# Patient Record
Sex: Female | Born: 1990 | Race: Black or African American | Hispanic: No | Marital: Single | Smoking: Current every day smoker
Health system: Southern US, Community
[De-identification: ages and names within clinical notes are randomized; demographics above are authoritative.]

## PROBLEM LIST (undated history)

## (undated) DIAGNOSIS — J45909 Unspecified asthma, uncomplicated: Secondary | ICD-10-CM

## (undated) DIAGNOSIS — I10 Essential (primary) hypertension: Secondary | ICD-10-CM

## (undated) HISTORY — PX: DILATION AND CURETTAGE OF UTERUS: SHX78

---

## 2013-02-24 ENCOUNTER — Emergency Department (HOSPITAL_COMMUNITY)
Admission: EM | Admit: 2013-02-24 | Discharge: 2013-02-24 | Disposition: A | Discharge status: Home / Self Care | Payer: No Typology Code available for payment source | Attending: Emergency Medicine | Admitting: Emergency Medicine

## 2013-02-24 ENCOUNTER — Emergency Department (HOSPITAL_COMMUNITY): Payer: No Typology Code available for payment source

## 2013-02-24 ENCOUNTER — Encounter (HOSPITAL_COMMUNITY): Payer: Self-pay | Admitting: Emergency Medicine

## 2013-02-24 DIAGNOSIS — S0990XA Unspecified injury of head, initial encounter: Secondary | ICD-10-CM | POA: Insufficient documentation

## 2013-02-24 DIAGNOSIS — R519 Headache, unspecified: Secondary | ICD-10-CM

## 2013-02-24 DIAGNOSIS — S01501A Unspecified open wound of lip, initial encounter: Secondary | ICD-10-CM | POA: Insufficient documentation

## 2013-02-24 DIAGNOSIS — R259 Unspecified abnormal involuntary movements: Secondary | ICD-10-CM | POA: Insufficient documentation

## 2013-02-24 DIAGNOSIS — Y92410 Unspecified street and highway as the place of occurrence of the external cause: Secondary | ICD-10-CM | POA: Insufficient documentation

## 2013-02-24 DIAGNOSIS — S0993XA Unspecified injury of face, initial encounter: Secondary | ICD-10-CM | POA: Insufficient documentation

## 2013-02-24 DIAGNOSIS — Y939 Activity, unspecified: Secondary | ICD-10-CM | POA: Insufficient documentation

## 2013-02-24 DIAGNOSIS — M542 Cervicalgia: Secondary | ICD-10-CM

## 2013-02-24 DIAGNOSIS — J45909 Unspecified asthma, uncomplicated: Secondary | ICD-10-CM | POA: Insufficient documentation

## 2013-02-24 DIAGNOSIS — S01511A Laceration without foreign body of lip, initial encounter: Secondary | ICD-10-CM

## 2013-02-24 HISTORY — DX: Unspecified asthma, uncomplicated: J45.909

## 2013-02-24 MED ORDER — MORPHINE SULFATE 4 MG/ML IJ SOLN
4.0000 mg | Freq: Once | INTRAMUSCULAR | Status: AC
  Administered 2013-02-24: 4 mg via INTRAVENOUS
  Filled 2013-02-24: qty 1

## 2013-02-24 MED ORDER — IBUPROFEN 600 MG PO TABS: 600.0000 mg | ORAL_TABLET | Freq: Three times a day (TID) | ORAL | Status: DC | PRN
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
## 2015-03-29 ENCOUNTER — Emergency Department (HOSPITAL_BASED_OUTPATIENT_CLINIC_OR_DEPARTMENT_OTHER): Payer: Self-pay

## 2015-03-29 ENCOUNTER — Encounter (HOSPITAL_BASED_OUTPATIENT_CLINIC_OR_DEPARTMENT_OTHER): Payer: Self-pay | Admitting: *Deleted

## 2015-03-29 ENCOUNTER — Emergency Department (HOSPITAL_BASED_OUTPATIENT_CLINIC_OR_DEPARTMENT_OTHER)
Admission: EM | Admit: 2015-03-29 | Discharge: 2015-03-30 | Disposition: A | Discharge status: Home / Self Care | Payer: Self-pay | Attending: Emergency Medicine | Admitting: Emergency Medicine

## 2015-03-29 DIAGNOSIS — S39012A Strain of muscle, fascia and tendon of lower back, initial encounter: Secondary | ICD-10-CM | POA: Insufficient documentation

## 2015-03-29 DIAGNOSIS — Y990 Civilian activity done for income or pay: Secondary | ICD-10-CM | POA: Insufficient documentation

## 2015-03-29 DIAGNOSIS — X58XXXA Exposure to other specified factors, initial encounter: Secondary | ICD-10-CM | POA: Insufficient documentation

## 2015-03-29 DIAGNOSIS — N939 Abnormal uterine and vaginal bleeding, unspecified: Secondary | ICD-10-CM

## 2015-03-29 DIAGNOSIS — M545 Low back pain, unspecified: Secondary | ICD-10-CM

## 2015-03-29 DIAGNOSIS — O26891 Other specified pregnancy related conditions, first trimester: Secondary | ICD-10-CM

## 2015-03-29 DIAGNOSIS — F17210 Nicotine dependence, cigarettes, uncomplicated: Secondary | ICD-10-CM | POA: Insufficient documentation

## 2015-03-29 DIAGNOSIS — O9A211 Injury, poisoning and certain other consequences of external causes complicating pregnancy, first trimester: Secondary | ICD-10-CM | POA: Insufficient documentation

## 2015-03-29 DIAGNOSIS — Y9289 Other specified places as the place of occurrence of the external cause: Secondary | ICD-10-CM | POA: Insufficient documentation

## 2015-03-29 DIAGNOSIS — O99331 Smoking (tobacco) complicating pregnancy, first trimester: Secondary | ICD-10-CM | POA: Insufficient documentation

## 2015-03-29 DIAGNOSIS — O209 Hemorrhage in early pregnancy, unspecified: Secondary | ICD-10-CM | POA: Insufficient documentation

## 2015-03-29 DIAGNOSIS — Z3A01 Less than 8 weeks gestation of pregnancy: Secondary | ICD-10-CM | POA: Insufficient documentation

## 2015-03-29 DIAGNOSIS — Y9389 Activity, other specified: Secondary | ICD-10-CM | POA: Insufficient documentation

## 2015-03-29 DIAGNOSIS — O99511 Diseases of the respiratory system complicating pregnancy, first trimester: Secondary | ICD-10-CM | POA: Insufficient documentation

## 2015-03-29 DIAGNOSIS — Z79899 Other long term (current) drug therapy: Secondary | ICD-10-CM | POA: Insufficient documentation

## 2015-03-29 DIAGNOSIS — J45909 Unspecified asthma, uncomplicated: Secondary | ICD-10-CM | POA: Insufficient documentation

## 2015-03-29 DIAGNOSIS — Z3491 Encounter for supervision of normal pregnancy, unspecified, first trimester: Secondary | ICD-10-CM

## 2015-03-29 LAB — URINALYSIS, ROUTINE W REFLEX MICROSCOPIC
Bilirubin Urine: NEGATIVE
GLUCOSE, UA: NEGATIVE mg/dL
KETONES UR: NEGATIVE mg/dL
Leukocytes, UA: NEGATIVE
Nitrite: NEGATIVE
Protein, ur: NEGATIVE mg/dL
SPECIFIC GRAVITY, URINE: 1.008 (ref 1.005–1.030)
UROBILINOGEN UA: 0.2 mg/dL (ref 0.0–1.0)
pH: 6 (ref 5.0–8.0)

## 2015-03-29 LAB — URINE MICROSCOPIC-ADD ON

## 2015-03-29 LAB — PREGNANCY, URINE: PREG TEST UR: POSITIVE — AB

## 2015-03-29 NOTE — ED Notes (Signed)
Back pain x 3 days. No injury. No urinary symptoms. Movement makes it worse.

## 2015-03-29 NOTE — ED Provider Notes (Addendum)
CSN: [id number]     Arrival date & time 03/29/15  2035 History  This chart was scribed for Blanchie Dessert, MD by Meriel Pica, ED Scribe. This patient was seen in room MH03/MH03 and the patient's care was started 11:22 PM.   Chief Complaint  Patient presents with  . Back Pain   The history is provided by the patient. No language interpreter was used.   HPI Comments: Alyssa Mooney is a 24 y.o. female who presents to the Emergency Department complaining of gradually worsening, constant, moderate lower back pain onset 3 days ago. 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 She reports lifting items and bending over exacerbates her pain. She additionally reports moderate spotting for the last 2 weeks. LNMP June 23rd. Last urination was 2.5 hours ago. She has not taken any alleviating medication in the past 3 days. Pt reports 1 sexual partner and denies any past exposure to STDs. Additionally denies abdominal pain, any urinary symptoms, malodorous vaginal discharge or urine, taking birth control or daily medication.   Past Medical History  Diagnosis Date  . Asthma    History reviewed. No pertinent past surgical history. No family history on file. History  Substance Use Topics  . Smoking status: Current Every Day Smoker -- 0.50 packs/day for 3 years    Types: Cigarettes  . Smokeless tobacco: Not on file  . Alcohol Use: No   OB History    No data available     Review of Systems  Gastrointestinal: Negative for abdominal pain.  Genitourinary: Positive for vaginal bleeding ( spotting). Negative for dysuria, urgency, frequency, hematuria and vaginal discharge.  Musculoskeletal: Positive for back pain.  All other systems reviewed and are negative.   Allergies  Review of patient's allergies indicates no known allergies.  Home Medications   Prior to Admission medications   Medication Sig Start Date End Date Taking? Authorizing  Provider  albuterol (PROVENTIL HFA;VENTOLIN HFA) 108 (90 BASE) MCG/ACT inhaler Inhale 2 puffs into the lungs every 6 (six) hours as needed for wheezing.    Historical Provider, MD  albuterol (PROVENTIL) (2.5 MG/3ML) 0.083% nebulizer solution Take 2.5 mg by nebulization every 6 (six) hours as needed for wheezing.    Historical Provider, MD  ibuprofen (ADVIL,MOTRIN) 600 MG tablet Take 1 tablet (600 mg total) by mouth every 8 (eight) hours as needed for pain. 02/24/13   Jola Schmidt, MD   BP 146/75 mmHg  Pulse 82  Temp(Src) 98.7 F (37.1 C) (Oral)  Resp 20  Ht 5\' 2"  (1.575 m)  Wt 118 lb (53.524 kg)  BMI 21.58 kg/m2  SpO2 99%  LMP 02/18/2015 Physical Exam  Constitutional: She appears well-developed and well-nourished. No distress.  HENT:  Head: Normocephalic.  Eyes: Conjunctivae are normal. Right eye exhibits no discharge. Left eye exhibits no discharge. No scleral icterus.  Neck: No JVD present.  Cardiovascular: Normal rate, regular rhythm and normal heart sounds.   Pulmonary/Chest: Effort normal and breath sounds normal. No respiratory distress.  Abdominal: Soft. Bowel sounds are normal. She exhibits no distension and no mass. There is no tenderness. There is no rebound and no guarding.  Genitourinary: Uterus normal. There is no rash on the right labia. There is no rash on the left labia. Cervix exhibits no motion tenderness, no discharge and no friability. Right adnexum displays no mass and no tenderness. Left adnexum displays no mass and no tenderness. No tenderness or bleeding in  the vagina. No vaginal discharge found.  Musculoskeletal: She exhibits tenderness.  Para lumbar tenderness bilaterally.  Neurological: She is alert. Coordination normal.  Skin: Skin is warm. No rash noted. No erythema. No pallor.  Psychiatric: She has a normal mood and affect. Her behavior is normal.  Nursing note and vitals reviewed.   ED Course  Procedures  DIAGNOSTIC STUDIES: Oxygen Saturation is 99%  on RA, normal by my interpretation.    COORDINATION OF CARE: 11:29 PM Discussed treatment plan which includes to order and prescribe tylenol and a muscle relaxer with pt. Advised pt to apply a heating pad to affected area. Discussed urine pregnancy results with pt and performed US OB at bedside. Will give OB GYN referral to pt. Pt acknowledges and agrees to plan.  12:41 AM Pelvic exam performed with chaperone present throughout entire exam.   Labs Review Labs Reviewed  WET PREP, GENITAL - Abnormal; Notable for the following:    Clue Cells Wet Prep HPF POC FEW (*)    WBC, Wet Prep HPF POC FEW (*)    All other components within normal limits  URINALYSIS, ROUTINE W REFLEX MICROSCOPIC (NOT AT [facility]) - Abnormal; Notable for the following:    Hgb urine dipstick SMALL (*)    All other components within normal limits  PREGNANCY, URINE - Abnormal; Notable for the following:    Preg Test, Ur POSITIVE (*)    All other components within normal limits  HCG, QUANTITATIVE, PREGNANCY - Abnormal; Notable for the following:    hCG, Beta Chain, Quant, S 6754 (*)    All other components within normal limits  URINE MICROSCOPIC-ADD ON  ABO/RH  GC/CHLAMYDIA PROBE AMP ([place]) NOT AT [facility]    Imaging Review US Ob Comp Less 14 Wks  03/30/2015   CLINICAL DATA:  Lower back pain.  Vaginal spotting.  EXAM: OBSTETRIC <14 WK [place] AND TRANSVAGINAL OB US  TECHNIQUE: Both transabdominal and transvaginal ultrasound examinations were performed for complete evaluation of the gestation as well as the maternal uterus, adnexal regions, and pelvic cul-de-sac. Transvaginal technique was performed to assess early pregnancy.  COMPARISON:  None.  FINDINGS: Intrauterine gestational sac: Single  Yolk sac:  Yes  Embryo:  No  Cardiac Activity: No  MSD: 6.7  mm   5 w   3  d  [place] EDC: 11/26/2015  Maternal uterus/adnexae: 3 uterine fibroids are visible, the largest measuring 2 cm. There is an involuting left ovarian corpus luteal cyst. The  right ovary contains a simple unilocular 2.7 cm cyst.  IMPRESSION: Single intrauterine gestational sac containing a yolk sac but no visible fetal pole yet. Recommend follow-up to confirm viability. At least 3 uterine fibroids, measuring up to 2 cm.   Electronically Signed   By: Andreas Newport M.D.   On: 03/30/2015 00:48   US Ob Transvaginal  03/30/2015   CLINICAL DATA:  Lower back pain.  Vaginal spotting.  EXAM: OBSTETRIC <14 WK [place] AND TRANSVAGINAL OB US  TECHNIQUE: Both transabdominal and transvaginal ultrasound examinations were performed for complete evaluation of the gestation as well as the maternal uterus, adnexal regions, and pelvic cul-de-sac. Transvaginal technique was performed to assess early pregnancy.  COMPARISON:  None.  FINDINGS: Intrauterine gestational sac: Single  Yolk sac:  Yes  Embryo:  No  Cardiac Activity: No  MSD: 6.7  mm   5 w   3  d  [place] EDC: 11/26/2015  Maternal uterus/adnexae: 3 uterine fibroids are visible, the largest measuring 2 cm.  There is an involuting left ovarian corpus luteal cyst. The right ovary contains a simple unilocular 2.7 cm cyst.  IMPRESSION: Single intrauterine gestational sac containing a yolk sac but no visible fetal pole yet. Recommend follow-up to confirm viability. At least 3 uterine fibroids, measuring up to 2 cm.   Electronically Signed   By: Andreas Newport M.D.   On: 03/30/2015 00:48     EKG Interpretation None      MDM   Final diagnoses:  Lumbar strain, initial encounter  First trimester pregnancy    Patient here complaining of musculoskeletal back pain that started 3 days ago. She is less heavy boxes at work and is most likely the cause of her symptoms. She denies any vaginal discharge itching burning or abdominal pain. She has had intermittent spotting for the last 2 weeks with her last normal menses at the end of June. Urine today was negative for infection but urine pregnancy test was positive. Pelvic exam without any concerning  findings. Patient is unaware of her Rh status.  GC chlamydia and transvaginal ultrasound performed.  Ultrasound showing gestational sac and yolk sac but no obvious signs of a fetus at this time however measuring about 5 weeks 3 days which is consistent with last period.  Patient was given Flexeril and Tylenol for her lumbar strain and recommended follow-up within the next month to OB/GYN  I personally performed the services described in this documentation, which was scribed in my presence.  The recorded information has been reviewed and considered.    Blanchie Dessert, MD 03/30/15 [id number]  Blanchie Dessert, MD 03/30/15 [phone]

## 2015-03-30 LAB — WET PREP, GENITAL
Trich, Wet Prep: NONE SEEN
YEAST WET PREP: NONE SEEN

## 2015-03-30 LAB — GC/CHLAMYDIA PROBE AMP (~~LOC~~) NOT AT ARMC
CHLAMYDIA, DNA PROBE: NEGATIVE
NEISSERIA GONORRHEA: NEGATIVE

## 2015-03-30 LAB — ABO/RH: ABO/RH(D): O POS

## 2015-03-30 LAB — HCG, QUANTITATIVE, PREGNANCY: hCG, Beta Chain, Quant, S: 6754 m[IU]/mL — ABNORMAL HIGH (ref <5)

## 2015-03-30 MED ORDER — ACETAMINOPHEN 500 MG PO TABS: 500.0000 mg | ORAL_TABLET | Freq: Four times a day (QID) | ORAL | Status: AC | PRN

## 2015-03-30 MED ORDER — CYCLOBENZAPRINE HCL 10 MG PO TABS: 10.0000 mg | ORAL_TABLET | Freq: Two times a day (BID) | ORAL | Status: AC | PRN

## 2016-05-22 ENCOUNTER — Emergency Department (HOSPITAL_BASED_OUTPATIENT_CLINIC_OR_DEPARTMENT_OTHER)
Admission: EM | Admit: 2016-05-22 | Discharge: 2016-05-22 | Disposition: A | Discharge status: Home / Self Care | Payer: Self-pay | Attending: Dermatology | Admitting: Dermatology

## 2016-05-22 DIAGNOSIS — F17210 Nicotine dependence, cigarettes, uncomplicated: Secondary | ICD-10-CM | POA: Insufficient documentation

## 2016-05-22 DIAGNOSIS — Z7951 Long term (current) use of inhaled steroids: Secondary | ICD-10-CM | POA: Insufficient documentation

## 2016-05-22 DIAGNOSIS — Z5321 Procedure and treatment not carried out due to patient leaving prior to being seen by health care provider: Secondary | ICD-10-CM | POA: Insufficient documentation

## 2016-05-22 DIAGNOSIS — R112 Nausea with vomiting, unspecified: Secondary | ICD-10-CM | POA: Insufficient documentation

## 2016-05-22 DIAGNOSIS — J45909 Unspecified asthma, uncomplicated: Secondary | ICD-10-CM | POA: Insufficient documentation

## 2016-05-22 NOTE — ED Notes (Signed)
Pt very upset that she is having to wait.  Pt talking rude, family upset.  States that they can no longer wait to be seen.  No distress when leaving the ED.

## 2016-05-22 NOTE — ED Triage Notes (Signed)
Headache off and on per Pt.   Pt. Reports she has history of migrans and they come and go.  Pt. Spitting in triage not vomiting.

## 2020-11-11 ENCOUNTER — Encounter (HOSPITAL_BASED_OUTPATIENT_CLINIC_OR_DEPARTMENT_OTHER): Payer: Self-pay

## 2020-11-11 ENCOUNTER — Other Ambulatory Visit: Payer: Self-pay

## 2020-11-11 ENCOUNTER — Emergency Department (HOSPITAL_BASED_OUTPATIENT_CLINIC_OR_DEPARTMENT_OTHER)
Admission: EM | Admit: 2020-11-11 | Discharge: 2020-11-11 | Disposition: A | Discharge status: Home / Self Care | Payer: Medicaid Other | Attending: Emergency Medicine | Admitting: Emergency Medicine

## 2020-11-11 ENCOUNTER — Emergency Department (HOSPITAL_BASED_OUTPATIENT_CLINIC_OR_DEPARTMENT_OTHER): Payer: Medicaid Other

## 2020-11-11 DIAGNOSIS — F17210 Nicotine dependence, cigarettes, uncomplicated: Secondary | ICD-10-CM | POA: Insufficient documentation

## 2020-11-11 DIAGNOSIS — J45909 Unspecified asthma, uncomplicated: Secondary | ICD-10-CM | POA: Diagnosis not present

## 2020-11-11 DIAGNOSIS — N939 Abnormal uterine and vaginal bleeding, unspecified: Secondary | ICD-10-CM | POA: Insufficient documentation

## 2020-11-11 DIAGNOSIS — R102 Pelvic and perineal pain: Secondary | ICD-10-CM | POA: Diagnosis present

## 2020-11-11 DIAGNOSIS — D259 Leiomyoma of uterus, unspecified: Secondary | ICD-10-CM

## 2020-11-11 DIAGNOSIS — N938 Other specified abnormal uterine and vaginal bleeding: Secondary | ICD-10-CM

## 2020-11-11 LAB — CBC WITH DIFFERENTIAL/PLATELET
Abs Immature Granulocytes: 0.03 10*3/uL (ref 0.00–0.07)
Basophils Absolute: 0 10*3/uL (ref 0.0–0.1)
Basophils Relative: 0 %
Eosinophils Absolute: 0.3 10*3/uL (ref 0.0–0.5)
Eosinophils Relative: 5 %
HCT: 38.8 % (ref 36.0–46.0)
Hemoglobin: 13.1 g/dL (ref 12.0–15.0)
Immature Granulocytes: 0 %
Lymphocytes Relative: 25 %
Lymphs Abs: 1.9 10*3/uL (ref 0.7–4.0)
MCH: 33 pg (ref 26.0–34.0)
MCHC: 33.8 g/dL (ref 30.0–36.0)
MCV: 97.7 fL (ref 80.0–100.0)
Monocytes Absolute: 0.7 10*3/uL (ref 0.1–1.0)
Monocytes Relative: 9 %
Neutro Abs: 4.5 10*3/uL (ref 1.7–7.7)
Neutrophils Relative %: 61 %
Platelets: 207 10*3/uL (ref 150–400)
RBC: 3.97 MIL/uL (ref 3.87–5.11)
RDW: 13.2 % (ref 11.5–15.5)
WBC: 7.4 10*3/uL (ref 4.0–10.5)
nRBC: 0 % (ref 0.0–0.2)

## 2020-11-11 LAB — URINALYSIS, ROUTINE W REFLEX MICROSCOPIC
Bilirubin Urine: NEGATIVE
Glucose, UA: NEGATIVE mg/dL
Ketones, ur: NEGATIVE mg/dL
Leukocytes,Ua: NEGATIVE
Nitrite: NEGATIVE
Protein, ur: NEGATIVE mg/dL
Specific Gravity, Urine: <1.005 — ABNORMAL LOW (ref 1.005–1.030)
pH: 6 (ref 5.0–8.0)

## 2020-11-11 LAB — BASIC METABOLIC PANEL
Anion gap: 5 (ref 5–15)
BUN: 13 mg/dL (ref 6–20)
CO2: 25 mmol/L (ref 22–32)
Calcium: 8.9 mg/dL (ref 8.9–10.3)
Chloride: 108 mmol/L (ref 98–111)
Creatinine, Ser: 0.79 mg/dL (ref 0.44–1.00)
GFR, Estimated: >60 mL/min (ref >60)
Glucose, Bld: 95 mg/dL (ref 70–99)
Potassium: 4.2 mmol/L (ref 3.5–5.1)
Sodium: 138 mmol/L (ref 135–145)

## 2020-11-11 LAB — URINALYSIS, MICROSCOPIC (REFLEX)

## 2020-11-11 LAB — PREGNANCY, URINE: Preg Test, Ur: NEGATIVE

## 2020-11-11 MED ORDER — FENTANYL CITRATE (PF) 100 MCG/2ML IJ SOLN
50.0000 ug | Freq: Once | INTRAMUSCULAR | Status: AC
  Administered 2020-11-11: 50 ug via INTRAVENOUS
  Filled 2020-11-11: qty 2

## 2020-11-11 MED ORDER — MEGESTROL ACETATE 40 MG PO TABS: 40.0000 mg | ORAL_TABLET | Freq: Every day | ORAL | 0 refills | Status: AC

## 2020-11-11 MED ORDER — SODIUM CHLORIDE 0.9 % IV BOLUS
1000.0000 mL | Freq: Once | INTRAVENOUS | Status: AC
  Administered 2020-11-11: 1000 mL via INTRAVENOUS

## 2020-11-11 MED ORDER — IBUPROFEN 600 MG PO TABS: 600.0000 mg | ORAL_TABLET | Freq: Three times a day (TID) | ORAL | 0 refills | Status: AC | PRN

## 2020-11-11 MED ORDER — MORPHINE SULFATE (PF) 4 MG/ML IV SOLN
4.0000 mg | Freq: Once | INTRAVENOUS | Status: AC
Start: 2020-11-11 — End: 2020-11-11
  Administered 2020-11-11: 4 mg via INTRAVENOUS
  Filled 2020-11-11: qty 1

## 2020-11-11 NOTE — ED Triage Notes (Signed)
Lower abd pain radiating to bilateral flanks.  Pt also c/o vaginal bleeding x 3 weeks with some irregular menstrual cycles.  Denies vaginal discharge otherwise or any urinary sxs.

## 2020-11-11 NOTE — ED Provider Notes (Signed)
[facility] EMERGENCY DEPARTMENT Provider Note   CSN: [id number] Arrival date & time: 11/11/20  0848     History Chief Complaint  Patient presents with  . Abdominal Pain    Alyssa Mooney is a 30 y.o. female.  She is G1, P0 with prior history of miscarriage.  Complaining of vaginal bleeding for 3 weeks sometimes bright red sometimes dark red.  Using 5 pads a day.  Started with abdominal pain lower 1 week ago radiates into back.  8 out of 10 intensity.  No fevers or chills no chest pain or shortness of breath.  The history is provided by the patient.  Abdominal Pain Pain location:  Suprapubic Pain quality: cramping   Pain radiates to:  Back, L flank and R flank Pain severity:  Severe Onset quality:  Gradual Duration:  1 week Timing:  Constant Progression:  Unchanged Chronicity:  New Context: not trauma   Relieved by:  Nothing Worsened by:  Nothing Ineffective treatments:  Acetaminophen Associated symptoms: vaginal bleeding   Associated symptoms: no chest pain, no dysuria, no fever, no nausea, no shortness of breath, no sore throat and no vomiting   Vaginal bleeding:    Quality:  Bright red and dark red   Severity:  Moderate   Number of pads used:  5/day   Onset quality:  Gradual   Progression:  Waxing and waning   Chronicity:  New      Past Medical History:  Diagnosis Date  . Asthma     There are no problems to display for this patient.   History reviewed. No pertinent surgical history.   OB History   No obstetric history on file.     History reviewed. No pertinent family history.  Social History   Tobacco Use  . Smoking status: Current Every Day Smoker    Packs/day: 0.50    Years: 3.00    Pack years: 1.50    Types: Cigarettes  . Smokeless tobacco: Never Used  Vaping Use  . Vaping Use: Never used  Substance Use Topics  . Alcohol use: No  . Drug use: No    Home Medications Prior to Admission medications   Medication Sig Start  Date End Date Taking? Authorizing Provider  acetaminophen (TYLENOL) 500 MG tablet Take 1 tablet (500 mg total) by mouth every 6 (six) hours as needed. 03/30/15  Yes Plunkett, Loree Fee, MD  albuterol (PROVENTIL HFA;VENTOLIN HFA) 108 (90 BASE) MCG/ACT inhaler Inhale 2 puffs into the lungs every 6 (six) hours as needed for wheezing.   Yes [provider]  albuterol (PROVENTIL) (2.5 MG/3ML) 0.083% nebulizer solution Take 2.5 mg by nebulization every 6 (six) hours as needed for wheezing.   Yes [provider]  ibuprofen (ADVIL,MOTRIN) 600 MG tablet Take 1 tablet (600 mg total) by mouth every 8 (eight) hours as needed for pain. 02/24/13  Yes Jola Schmidt, MD  cyclobenzaprine (FLEXERIL) 10 MG tablet Take 1 tablet (10 mg total) by mouth 2 (two) times daily as needed for muscle spasms. 03/30/15   Blanchie Dessert, MD    Allergies    Patient has no known allergies.  Review of Systems   Review of Systems  Constitutional: Negative for fever.  HENT: Negative for sore throat.   Eyes: Negative for visual disturbance.  Respiratory: Negative for shortness of breath.   Cardiovascular: Negative for chest pain.  Gastrointestinal: Positive for abdominal pain. Negative for nausea and vomiting.  Genitourinary: Positive for vaginal bleeding. Negative for dysuria.  Musculoskeletal:  Positive for back pain.  Skin: Negative for rash.  Neurological: Negative for headaches.    Physical Exam Updated Vital Signs BP (!) 127/91 (BP Location: Right Arm)   Pulse 94   Temp 98.5 F (36.9 C) (Oral)   Resp 16   Ht 5\' 1"  (1.549 m)   Wt 52.2 kg   SpO2 100%   BMI 21.73 kg/m   Physical Exam Vitals and nursing note reviewed.  Constitutional:      General: She is not in acute distress.    Appearance: She is well-developed.  HENT:     Head: Normocephalic and atraumatic.  Eyes:     Conjunctiva/sclera: Conjunctivae normal.  Cardiovascular:     Rate and Rhythm: Normal rate and regular rhythm.     Heart  sounds: No murmur heard.   Pulmonary:     Effort: Pulmonary effort is normal. No respiratory distress.     Breath sounds: Normal breath sounds.  Abdominal:     Palpations: Abdomen is soft. There is no mass.     Tenderness: There is abdominal tenderness in the right lower quadrant, suprapubic area and left lower quadrant. There is no guarding or rebound.  Musculoskeletal:        General: No deformity or signs of injury. Normal range of motion.     Cervical back: Neck supple.  Skin:    General: Skin is warm and dry.     Capillary Refill: Capillary refill takes less than 2 seconds.  Neurological:     General: No focal deficit present.     Mental Status: She is alert.     ED Results / Procedures / Treatments   Labs (all labs ordered are listed, but only abnormal results are displayed) Labs Reviewed  URINALYSIS, ROUTINE W REFLEX MICROSCOPIC - Abnormal; Notable for the following components:      Result Value   Specific Gravity, Urine <1.005 (*)    Hgb urine dipstick LARGE (*)    All other components within normal limits  URINALYSIS, MICROSCOPIC (REFLEX) - Abnormal; Notable for the following components:   Bacteria, UA RARE (*)    All other components within normal limits  PREGNANCY, URINE  BASIC METABOLIC PANEL  CBC WITH DIFFERENTIAL/PLATELET    EKG None  Radiology US PELVIC COMPLETE W TRANSVAGINAL AND TORSION R/O  Result Date: 11/11/2020 CLINICAL DATA:  Pelvic pain EXAM: TRANSABDOMINAL AND TRANSVAGINAL ULTRASOUND OF PELVIS DOPPLER ULTRASOUND OF OVARIES TECHNIQUE: Study was performed transabdominally to optimize pelvic field of view evaluation and transvaginally to optimize internal visceral architecture evaluation. Color and duplex Doppler ultrasound was utilized to evaluate blood flow to the ovaries. COMPARISON:  CT pelvis July 17, 2020 FINDINGS: Uterus Measurements: Uterus measures 10.9 x 7.2 x 8.8 cm = volume: 365.0 mL. There is diffuse inhomogeneity throughout the uterus  consistent with diffuse leiomyomatous change. There is a focal leiomyoma along the superior fundus measuring 4.9 x 5.4 x 5.0 cm. Mid fundal leiomyomas measuring 1.8 x 1.8 x 2.1 cm and 2.2 x 1.4 x 1.6 cm noted. Probable smaller leiomyomas are less well-defined. Endometrium Thickness: 5 mm.  No focal abnormality visualized. Right ovary Measurements: 3.7 x 1.5 x 2.1 cm = volume: 6.2 mL. Normal appearance/no adnexal mass. Left ovary Unable to visualize by transabdominal transvaginal technique. No left-sided pelvic mass evident. Pulsed Doppler evaluation of right ovary demonstrates normal low-resistance arterial and venous waveforms. Left ovary could not be interrogated. Other findings No abnormal free fluid. IMPRESSION: 1. Enlarged leiomyomatous uterus with several well-defined leiomyomas  and probable smaller leiomyomas elsewhere throughout the uterus. 2.  Normal appearing right ovary.  No evident right ovarian torsion. 3. Unable to visualize left ovary by either transabdominal transvaginal technique. No left-sided pelvic mass evident. 4.  No evident free pelvic fluid. Electronically Signed   By: Lowella Grip III M.D.   On: 11/11/2020 11:23    Procedures Procedures   Medications Ordered in ED Medications  sodium chloride 0.9 % bolus 1,000 mL (0 mLs Intravenous Stopped 11/11/20 1052)  fentaNYL (SUBLIMAZE) injection 50 mcg (50 mcg Intravenous Given 11/11/20 0924)  morphine 4 MG/ML injection 4 mg (4 mg Intravenous Given 11/11/20 1100)    ED Course  I have reviewed the triage vital signs and the nursing notes.  Pertinent labs & imaging results that were available during my care of the patient were reviewed by me and considered in my medical decision making (see chart for details).  Clinical Course as of 11/11/20 2035  Thu Nov 11, 2020  1143 Discussed with Dr. Rosana Hoes from gynecology.  She recommended Megace 40 mg daily can increase to twice daily if not effective.  NSAIDs.  Follow-up with gynecology.  [MB]  1740 Reviewed recommendations from gynecology with patient and she is comfortable plan for discharge to follow-up with her gynecologist.  Will provide prescriptions for Megace and ibuprofen. [MB]    Clinical Course User Index [MB] Hayden Rasmussen, MD   MDM Rules/Calculators/A&P                         This patient complains of lower abdominal pain, late.,  Vaginal bleeding; this involves an extensive number of treatment Options and is a complaint that carries with it a high risk of complications and Morbidity. The differential includes dysfunctional uterine bleeding, miscarriage, ectopic  I ordered, reviewed and interpreted labs, which included CBC with normal white count and hemoglobin, chemistries normal, pregnancy test negative, urinalysis without gross signs of infection I ordered medication IV fluids and IV pain medication I ordered imaging studies which included pelvic ultrasound and I independently    visualized and interpreted imaging which showed no evidence of torsion, does have leiomyomas Previous records obtained and reviewed in epic, no recent admissions I consulted Dr. Rosana Hoes gynecology and discussed lab and imaging findings  Critical Interventions: None  After the interventions stated above, I reevaluated the patient and found patient to be symptomatically improved.  Reviewed recommendations from gynecology with patient and she is comfortable with plan for trial of Megace.  NSAIDs.  She will follow up with her gynecologist next week.  Return instructions discussed   Final Clinical Impression(s) / ED Diagnoses Final diagnoses:  Dysfunctional uterine bleeding  Uterine leiomyoma, unspecified location    Rx / DC Orders ED Discharge Orders         Ordered    megestrol (MEGACE) 40 MG tablet  Daily        11/11/20 1149    ibuprofen (ADVIL) 600 MG tablet  Every 8 hours PRN        11/11/20 1149           Hayden Rasmussen, MD 11/11/20 2037

## 2020-11-11 NOTE — Discharge Instructions (Addendum)
You were seen in the emergency department for 3 weeks of vaginal bleeding and low abdominal pain.  You had blood work that showed your red blood cells were stable.  Your pregnancy test was negative.  Your pelvic ultrasound showed some fibroids.  We are starting you on some medication to help with pain and help with bleeding.  Please follow-up with your gynecologist as scheduled.  Return to the emergency department for any worsening or concerning symptoms

## 2021-06-23 ENCOUNTER — Emergency Department (HOSPITAL_BASED_OUTPATIENT_CLINIC_OR_DEPARTMENT_OTHER)
Admission: EM | Admit: 2021-06-23 | Discharge: 2021-06-23 | Disposition: A | Discharge status: Home / Self Care | Payer: Medicaid Other | Attending: Student | Admitting: Student

## 2021-06-23 ENCOUNTER — Other Ambulatory Visit: Payer: Self-pay

## 2021-06-23 ENCOUNTER — Emergency Department (HOSPITAL_BASED_OUTPATIENT_CLINIC_OR_DEPARTMENT_OTHER): Payer: Medicaid Other

## 2021-06-23 ENCOUNTER — Encounter (HOSPITAL_BASED_OUTPATIENT_CLINIC_OR_DEPARTMENT_OTHER): Payer: Self-pay | Admitting: *Deleted

## 2021-06-23 DIAGNOSIS — R0789 Other chest pain: Secondary | ICD-10-CM | POA: Diagnosis not present

## 2021-06-23 DIAGNOSIS — F17210 Nicotine dependence, cigarettes, uncomplicated: Secondary | ICD-10-CM | POA: Diagnosis not present

## 2021-06-23 DIAGNOSIS — R0602 Shortness of breath: Secondary | ICD-10-CM | POA: Diagnosis not present

## 2021-06-23 DIAGNOSIS — I10 Essential (primary) hypertension: Secondary | ICD-10-CM | POA: Diagnosis not present

## 2021-06-23 DIAGNOSIS — R109 Unspecified abdominal pain: Secondary | ICD-10-CM | POA: Insufficient documentation

## 2021-06-23 DIAGNOSIS — J45909 Unspecified asthma, uncomplicated: Secondary | ICD-10-CM | POA: Diagnosis not present

## 2021-06-23 DIAGNOSIS — Z79899 Other long term (current) drug therapy: Secondary | ICD-10-CM | POA: Diagnosis not present

## 2021-06-23 DIAGNOSIS — R102 Pelvic and perineal pain: Secondary | ICD-10-CM | POA: Insufficient documentation

## 2021-06-23 HISTORY — DX: Essential (primary) hypertension: I10

## 2021-06-23 LAB — COMPREHENSIVE METABOLIC PANEL
ALT: 12 U/L (ref 0–44)
AST: 16 U/L (ref 15–41)
Albumin: 4.2 g/dL (ref 3.5–5.0)
Alkaline Phosphatase: 50 U/L (ref 38–126)
Anion gap: 5 (ref 5–15)
BUN: 12 mg/dL (ref 6–20)
CO2: 24 mmol/L (ref 22–32)
Calcium: 9 mg/dL (ref 8.9–10.3)
Chloride: 109 mmol/L (ref 98–111)
Creatinine, Ser: 0.91 mg/dL (ref 0.44–1.00)
GFR, Estimated: >60 mL/min (ref >60)
Glucose, Bld: 98 mg/dL (ref 70–99)
Potassium: 4.1 mmol/L (ref 3.5–5.1)
Sodium: 138 mmol/L (ref 135–145)
Total Bilirubin: 0.4 mg/dL (ref 0.3–1.2)
Total Protein: 6.7 g/dL (ref 6.5–8.1)

## 2021-06-23 LAB — URINALYSIS, ROUTINE W REFLEX MICROSCOPIC
Bilirubin Urine: NEGATIVE
Glucose, UA: NEGATIVE mg/dL
Ketones, ur: NEGATIVE mg/dL
Leukocytes,Ua: NEGATIVE
Nitrite: NEGATIVE
Protein, ur: NEGATIVE mg/dL
Specific Gravity, Urine: 1.025 (ref 1.005–1.030)
pH: 5 (ref 5.0–8.0)

## 2021-06-23 LAB — CBC WITH DIFFERENTIAL/PLATELET
Abs Immature Granulocytes: 0.04 10*3/uL (ref 0.00–0.07)
Basophils Absolute: 0.1 10*3/uL (ref 0.0–0.1)
Basophils Relative: 1 %
Eosinophils Absolute: 0.4 10*3/uL (ref 0.0–0.5)
Eosinophils Relative: 4 %
HCT: 40.8 % (ref 36.0–46.0)
Hemoglobin: 14 g/dL (ref 12.0–15.0)
Immature Granulocytes: 0 %
Lymphocytes Relative: 22 %
Lymphs Abs: 2.3 10*3/uL (ref 0.7–4.0)
MCH: 32.8 pg (ref 26.0–34.0)
MCHC: 34.3 g/dL (ref 30.0–36.0)
MCV: 95.6 fL (ref 80.0–100.0)
Monocytes Absolute: 1 10*3/uL (ref 0.1–1.0)
Monocytes Relative: 9 %
Neutro Abs: 6.7 10*3/uL (ref 1.7–7.7)
Neutrophils Relative %: 64 %
Platelets: 213 10*3/uL (ref 150–400)
RBC: 4.27 MIL/uL (ref 3.87–5.11)
RDW: 14.1 % (ref 11.5–15.5)
WBC: 10.4 10*3/uL (ref 4.0–10.5)
nRBC: 0 % (ref 0.0–0.2)

## 2021-06-23 LAB — PREGNANCY, URINE: Preg Test, Ur: NEGATIVE

## 2021-06-23 LAB — URINALYSIS, MICROSCOPIC (REFLEX): RBC / HPF: >50 RBC/hpf (ref 0–5)

## 2021-06-23 LAB — LIPASE, BLOOD: Lipase: 26 U/L (ref 11–51)

## 2021-06-23 LAB — D-DIMER, QUANTITATIVE: D-Dimer, Quant: <0.27 ug/mL-FEU (ref 0.00–0.50)

## 2021-06-23 LAB — TROPONIN I (HIGH SENSITIVITY): Troponin I (High Sensitivity): <2 ng/L (ref <18)

## 2021-06-23 MED ORDER — KETOROLAC TROMETHAMINE 15 MG/ML IJ SOLN
15.0000 mg | Freq: Once | INTRAMUSCULAR | Status: AC
  Administered 2021-06-23: 15 mg via INTRAVENOUS
  Filled 2021-06-23: qty 1

## 2021-06-23 MED ORDER — KETOROLAC TROMETHAMINE 15 MG/ML IJ SOLN: 15.0000 mg | Freq: Once | INTRAMUSCULAR | Status: DC

## 2021-06-23 MED ORDER — LIDOCAINE 5 % EX PTCH
1.0000 | MEDICATED_PATCH | CUTANEOUS | Status: DC
  Administered 2021-06-23: 1 via TRANSDERMAL
  Filled 2021-06-23: qty 1

## 2021-06-23 NOTE — ED Triage Notes (Signed)
Pt c/o mid to left chest pain x 2 days and describes the pain as sharp; pt states she was sitting down when the pain started; pt c/o sob

## 2021-06-23 NOTE — ED Notes (Signed)
Pt discharged to home. Discharge instructions have been discussed with patient and/or family members. Pt verbally acknowledges understanding d/c instructions, and endorses comprehension to checkout at registration before leaving.  °

## 2021-06-23 NOTE — ED Notes (Signed)
ED Provider at bedside. 

## 2021-06-23 NOTE — ED Provider Notes (Signed)
[facility] EMERGENCY DEPARTMENT Provider Note   CSN: [id number] Arrival date & time: 06/23/21  3354     History Chief Complaint  Patient presents with   Chest Pain    Alyssa Mooney is a 30 y.o. female who presents emergency department for evaluation of chest pain.  Patient states she has had left-sided chest pain along the rib underneath the left breast for approximately 2 days.  She describes the pain as a 5 out of 10, sharp, worse with stretching and raising her arms above her head.  She endorses some mild shortness of breath.  Denies nausea, vomiting, diaphoresis or exertional symptoms.  She also endorses vaginal bleeding and states that her last menstrual period was 1 month ago.  She also endorses bilateral lower pelvic pain that is crampy in nature.  Denies dysuria, vaginal discharge, headache, fever, cough or other systemic symptoms.   Chest Pain Associated symptoms: abdominal pain and shortness of breath   Associated symptoms: no back pain, no cough, no fever, no palpitations and no vomiting       Past Medical History:  Diagnosis Date   Asthma    Hypertension     There are no problems to display for this patient.   Past Surgical History:  Procedure Laterality Date   DILATION AND CURETTAGE OF UTERUS       OB History   No obstetric history on file.     History reviewed. No pertinent family history.  Social History   Tobacco Use   Smoking status: Every Day    Packs/day: 0.50    Years: 3.00    Pack years: 1.50    Types: Cigarettes   Smokeless tobacco: Never  Vaping Use   Vaping Use: Never used  Substance Use Topics   Alcohol use: No   Drug use: No    Home Medications Prior to Admission medications   Medication Sig Start Date End Date Taking? Authorizing Provider  acetaminophen (TYLENOL) 500 MG tablet Take 1 tablet (500 mg total) by mouth every 6 (six) hours as needed. 03/30/15   Blanchie Dessert, MD  albuterol (PROVENTIL HFA;VENTOLIN  HFA) 108 (90 BASE) MCG/ACT inhaler Inhale 2 puffs into the lungs every 6 (six) hours as needed for wheezing.    [provider]  albuterol (PROVENTIL) (2.5 MG/3ML) 0.083% nebulizer solution Take 2.5 mg by nebulization every 6 (six) hours as needed for wheezing.    [provider]  cyclobenzaprine (FLEXERIL) 10 MG tablet Take 1 tablet (10 mg total) by mouth 2 (two) times daily as needed for muscle spasms. 03/30/15   Blanchie Dessert, MD  ibuprofen (ADVIL) 600 MG tablet Take 1 tablet (600 mg total) by mouth every 8 (eight) hours as needed for moderate pain. 11/11/20   Hayden Rasmussen, MD  megestrol (MEGACE) 40 MG tablet Take 1 tablet (40 mg total) by mouth daily. May increase to twice a day if not effective 11/11/20   Hayden Rasmussen, MD    Allergies    Patient has no known allergies.  Review of Systems   Review of Systems  Constitutional:  Negative for chills and fever.  HENT:  Negative for ear pain and sore throat.   Eyes:  Negative for pain and visual disturbance.  Respiratory:  Positive for shortness of breath. Negative for cough.   Cardiovascular:  Positive for chest pain. Negative for palpitations.  Gastrointestinal:  Positive for abdominal pain. Negative for vomiting.  Genitourinary:  Positive for vaginal bleeding. Negative for dysuria  and hematuria.  Musculoskeletal:  Negative for arthralgias and back pain.  Skin:  Negative for color change and rash.  Neurological:  Negative for seizures and syncope.  All other systems reviewed and are negative.  Physical Exam Updated Vital Signs BP 135/90 (BP Location: Right Arm)   Pulse 81   Temp 97.9 F (36.6 C) (Oral)   Resp 16   Ht 5\' 1"  (1.549 m)   Wt 56.7 kg   LMP 05/25/2021   SpO2 99%   BMI 23.62 kg/m   Physical Exam Vitals and nursing note reviewed.  Constitutional:      General: She is not in acute distress.    Appearance: She is well-developed.  HENT:     Head: Normocephalic and atraumatic.  Eyes:      Conjunctiva/sclera: Conjunctivae normal.  Cardiovascular:     Rate and Rhythm: Normal rate and regular rhythm.     Heart sounds: No murmur heard. Pulmonary:     Effort: Pulmonary effort is normal. No respiratory distress.     Breath sounds: Normal breath sounds.  Chest:     Chest wall: Tenderness (Along the rib line under the left breast) present.  Abdominal:     Palpations: Abdomen is soft.     Tenderness: There is abdominal tenderness (Mild left and right lower quadrant).  Musculoskeletal:     Cervical back: Neck supple.  Skin:    General: Skin is warm and dry.  Neurological:     Mental Status: She is alert.    ED Results / Procedures / Treatments   Labs (all labs ordered are listed, but only abnormal results are displayed) Labs Reviewed  COMPREHENSIVE METABOLIC PANEL  CBC WITH DIFFERENTIAL/PLATELET  D-DIMER, QUANTITATIVE  LIPASE, BLOOD  URINALYSIS, ROUTINE W REFLEX MICROSCOPIC  PREGNANCY, URINE  TROPONIN I (HIGH SENSITIVITY)    EKG EKG Interpretation  Date/Time:  Thursday June 23 2021 06:58:38 EDT Ventricular Rate:  64 PR Interval:  134 QRS Duration: 73 QT Interval:  378 QTC Calculation: 390 R Axis:   72 Text Interpretation: Sinus rhythm Confirmed by Tamico Mundo ([id number]) on 06/23/2021 7:28:48 AM  Radiology No results found.  Procedures Procedures   Medications Ordered in ED Medications - No data to display  ED Course  I have reviewed the triage vital signs and the nursing notes.  Pertinent labs & imaging results that were available during my care of the patient were reviewed by me and considered in my medical decision making (see chart for details).    MDM Rules/Calculators/A&P                           Patient seen the emergency department for evaluation of chest and abdominal pain.  Physical exam reveals mild left and lower quadrant tenderness to palpation and reproducible palpable tenderness along the rib line under the left breast..   Laboratory evaluation reveals large blood in the urine likely second of the patient's current menstrual period, pregnancy test negative, high-sensitivity troponin unremarkable D-dimer negative, CBC and CMP unremarkable.  ECG nonischemic, chest x-ray unremarkable.  Patient presentation consistent with costochondritis especially with the reproducible nature of the pain, and I have low suspicion for ACS at this time.  Her abdominal pain is likely secondary to menstrual cramping in the setting of her active menstrual period.  Patient then discharged with strict return precautions of which she voiced understanding. Final Clinical Impression(s) / ED Diagnoses Final diagnoses:  None    Rx /  DC Orders ED Discharge Orders     None        Elyzabeth Goatley, Debe Coder, MD 06/23/21 [phone]

## 2021-11-04 ENCOUNTER — Emergency Department (HOSPITAL_BASED_OUTPATIENT_CLINIC_OR_DEPARTMENT_OTHER)
Admission: EM | Admit: 2021-11-04 | Discharge: 2021-11-04 | Disposition: A | Discharge status: Home / Self Care | Payer: Medicaid Other | Attending: Emergency Medicine | Admitting: Emergency Medicine

## 2021-11-04 ENCOUNTER — Emergency Department (HOSPITAL_BASED_OUTPATIENT_CLINIC_OR_DEPARTMENT_OTHER): Payer: Medicaid Other

## 2021-11-04 ENCOUNTER — Other Ambulatory Visit: Payer: Self-pay

## 2021-11-04 ENCOUNTER — Encounter (HOSPITAL_BASED_OUTPATIENT_CLINIC_OR_DEPARTMENT_OTHER): Payer: Self-pay

## 2021-11-04 DIAGNOSIS — R102 Pelvic and perineal pain: Secondary | ICD-10-CM | POA: Diagnosis not present

## 2021-11-04 DIAGNOSIS — N939 Abnormal uterine and vaginal bleeding, unspecified: Secondary | ICD-10-CM | POA: Insufficient documentation

## 2021-11-04 DIAGNOSIS — R1030 Lower abdominal pain, unspecified: Secondary | ICD-10-CM | POA: Diagnosis present

## 2021-11-04 LAB — COMPREHENSIVE METABOLIC PANEL
ALT: 15 U/L (ref 0–44)
AST: 26 U/L (ref 15–41)
Albumin: 4.5 g/dL (ref 3.5–5.0)
Alkaline Phosphatase: 49 U/L (ref 38–126)
Anion gap: 10 (ref 5–15)
BUN: 9 mg/dL (ref 6–20)
CO2: 24 mmol/L (ref 22–32)
Calcium: 9 mg/dL (ref 8.9–10.3)
Chloride: 106 mmol/L (ref 98–111)
Creatinine, Ser: 0.84 mg/dL (ref 0.44–1.00)
GFR, Estimated: >60 mL/min (ref >60)
Glucose, Bld: 105 mg/dL — ABNORMAL HIGH (ref 70–99)
Potassium: 3.9 mmol/L (ref 3.5–5.1)
Sodium: 140 mmol/L (ref 135–145)
Total Bilirubin: 0.6 mg/dL (ref 0.3–1.2)
Total Protein: 7.2 g/dL (ref 6.5–8.1)

## 2021-11-04 LAB — URINALYSIS, ROUTINE W REFLEX MICROSCOPIC
Bilirubin Urine: NEGATIVE
Glucose, UA: NEGATIVE mg/dL
Ketones, ur: NEGATIVE mg/dL
Nitrite: NEGATIVE
Protein, ur: 100 mg/dL — AB
Specific Gravity, Urine: 1.015 (ref 1.005–1.030)
pH: >9 — ABNORMAL HIGH (ref 5.0–8.0)

## 2021-11-04 LAB — CBC
HCT: 41.1 % (ref 36.0–46.0)
Hemoglobin: 14.1 g/dL (ref 12.0–15.0)
MCH: 32.5 pg (ref 26.0–34.0)
MCHC: 34.3 g/dL (ref 30.0–36.0)
MCV: 94.7 fL (ref 80.0–100.0)
Platelets: 236 10*3/uL (ref 150–400)
RBC: 4.34 MIL/uL (ref 3.87–5.11)
RDW: 13.4 % (ref 11.5–15.5)
WBC: 11.9 10*3/uL — ABNORMAL HIGH (ref 4.0–10.5)
nRBC: 0 % (ref 0.0–0.2)

## 2021-11-04 LAB — URINALYSIS, MICROSCOPIC (REFLEX)

## 2021-11-04 LAB — PREGNANCY, URINE: Preg Test, Ur: NEGATIVE

## 2021-11-04 LAB — LIPASE, BLOOD: Lipase: 24 U/L (ref 11–51)

## 2021-11-04 MED ORDER — IBUPROFEN 600 MG PO TABS: 600.0000 mg | ORAL_TABLET | Freq: Three times a day (TID) | ORAL | 0 refills | Status: AC | PRN

## 2021-11-04 MED ORDER — FENTANYL CITRATE PF 50 MCG/ML IJ SOSY
50.0000 ug | PREFILLED_SYRINGE | Freq: Once | INTRAMUSCULAR | Status: AC
  Administered 2021-11-04: 50 ug via INTRAVENOUS
  Filled 2021-11-04: qty 1

## 2021-11-04 MED ORDER — HYDROCODONE-ACETAMINOPHEN 5-325 MG PO TABS
2.0000 | ORAL_TABLET | Freq: Once | ORAL | Status: AC
  Administered 2021-11-04: 2 via ORAL
  Filled 2021-11-04: qty 2

## 2021-11-04 MED ORDER — CEPHALEXIN 250 MG PO CAPS
1000.0000 mg | ORAL_CAPSULE | Freq: Once | ORAL | Status: AC
  Administered 2021-11-04: 1000 mg via ORAL
  Filled 2021-11-04: qty 4

## 2021-11-04 MED ORDER — HYDROCODONE-ACETAMINOPHEN 5-325 MG PO TABS: 1.0000 | ORAL_TABLET | Freq: Four times a day (QID) | ORAL | 0 refills | Status: AC | PRN

## 2021-11-04 MED ORDER — KETOROLAC TROMETHAMINE 30 MG/ML IJ SOLN
15.0000 mg | Freq: Once | INTRAMUSCULAR | Status: AC
  Administered 2021-11-04: 15 mg via INTRAVENOUS
  Filled 2021-11-04: qty 1

## 2021-11-04 MED ORDER — ONDANSETRON HCL 4 MG/2ML IJ SOLN
4.0000 mg | Freq: Once | INTRAMUSCULAR | Status: AC
  Administered 2021-11-04: 4 mg via INTRAVENOUS
  Filled 2021-11-04: qty 2

## 2021-11-04 MED ORDER — CEPHALEXIN 500 MG PO CAPS: 1000.0000 mg | ORAL_CAPSULE | Freq: Two times a day (BID) | ORAL | 0 refills | Status: AC

## 2021-11-04 NOTE — ED Notes (Signed)
ED Provider at bedside. 

## 2021-11-04 NOTE — ED Provider Notes (Signed)
?[facility] EMERGENCY DEPARTMENT ?Provider Note ? ? ?CSN: [id number] ?Arrival date & time: 11/04/21  1123 ? ?  ? ?History ? ?Chief Complaint  ?Patient presents with  ? Abdominal Pain  ? ? ?Alyssa Mooney is a 31 y.o. female. ? ? ?Abdominal Pain ?Associated symptoms: vaginal bleeding   ?Associated symptoms: no shortness of breath   ?Patient presents with abdominal pain.  Began acutely this morning.  Began around 6.  Has been throwing up.  Pain is in lower abdomen.  No dysuria.  Is somewhat late for her menses but states she started to bleed while she was in the bathroom today.  Does not know if she is pregnant.  Most of the history comes to the person with the patient.  Patient gave less of the history. ?  ? ?Home Medications ?Prior to Admission medications   ?Medication Sig Start Date End Date Taking? Authorizing Provider  ?acetaminophen (TYLENOL) 500 MG tablet Take 1 tablet (500 mg total) by mouth every 6 (six) hours as needed. 03/30/15   Blanchie Dessert, MD  ?albuterol (PROVENTIL HFA;VENTOLIN HFA) 108 (90 BASE) MCG/ACT inhaler Inhale 2 puffs into the lungs every 6 (six) hours as needed for wheezing.    [provider]  ?albuterol (PROVENTIL) (2.5 MG/3ML) 0.083% nebulizer solution Take 2.5 mg by nebulization every 6 (six) hours as needed for wheezing.    [provider]  ?cyclobenzaprine (FLEXERIL) 10 MG tablet Take 1 tablet (10 mg total) by mouth 2 (two) times daily as needed for muscle spasms. 03/30/15   Blanchie Dessert, MD  ?ibuprofen (ADVIL) 600 MG tablet Take 1 tablet (600 mg total) by mouth every 8 (eight) hours as needed for moderate pain. 11/11/20   Hayden Rasmussen, MD  ?megestrol (MEGACE) 40 MG tablet Take 1 tablet (40 mg total) by mouth daily. May increase to twice a day if not effective 11/11/20   Hayden Rasmussen, MD  ?   ? ?Allergies    ?Patient has no known allergies.   ? ?Review of Systems   ?Review of Systems  ?Constitutional:  Negative for appetite change.   ?Respiratory:  Negative for shortness of breath.   ?Gastrointestinal:  Positive for abdominal pain.  ?Genitourinary:  Positive for vaginal bleeding.  ?Musculoskeletal:  Negative for back pain.  ? ?Physical Exam ?Updated Vital Signs ?BP 133/89   Pulse 75   Temp 98.1 ?F (36.7 ?C) (Oral)   Resp 16   Ht '5\' 1"'$  (1.549 m)   Wt 63.5 kg   LMP 10/04/2021   SpO2 100%   BMI 26.45 kg/m?  ?Physical Exam ?Vitals reviewed.  ?Constitutional:   ?   Comments: Laying on her left side in the bed.  ?HENT:  ?   Head: Normocephalic.  ?Pulmonary:  ?   Breath sounds: Normal breath sounds.  ?Abdominal:  ?   Tenderness: There is abdominal tenderness.  ?   Comments: Lower abdominal tenderness out rebound or guarding.  No hernia palpated  ?Neurological:  ?   Mental Status: She is alert.  ? ? ?ED Results / Procedures / Treatments   ?Labs ?(all labs ordered are listed, but only abnormal results are displayed) ?Labs Reviewed  ?COMPREHENSIVE METABOLIC PANEL - Abnormal; Notable for the following components:  ?    Result Value  ? Glucose, Bld 105 (*)   ? All other components within normal limits  ?CBC - Abnormal; Notable for the following components:  ? WBC 11.9 (*)   ? All other components within  normal limits  ?URINALYSIS, ROUTINE W REFLEX MICROSCOPIC - Abnormal; Notable for the following components:  ? pH >9.0 (*)   ? Hgb urine dipstick TRACE (*)   ? Protein, ur 100 (*)   ? Leukocytes,Ua TRACE (*)   ? All other components within normal limits  ?URINALYSIS, MICROSCOPIC (REFLEX) - Abnormal; Notable for the following components:  ? Bacteria, UA FEW (*)   ? All other components within normal limits  ?LIPASE, BLOOD  ?PREGNANCY, URINE  ? ? ?EKG ?None ? ?Radiology ?CT Renal Stone Study ? ?Result Date: 11/04/2021 ?CLINICAL DATA:  Flank pain, kidney stone suspected EXAM: CT ABDOMEN AND PELVIS WITHOUT CONTRAST TECHNIQUE: Multidetector CT imaging of the abdomen and pelvis was performed following the standard protocol without IV contrast. RADIATION  DOSE REDUCTION: This exam was performed according to the departmental dose-optimization program which includes automated exposure control, adjustment of the mA and/or kV according to patient size and/or use of iterative reconstruction technique. COMPARISON:  November 2021 FINDINGS: Lower chest: No acute abnormality. Hepatobiliary: Small right hepatic lobe cyst. Gallbladder is unremarkable. No biliary dilatation. Pancreas: Unremarkable. Spleen: Unremarkable. Adrenals/Urinary Tract: Adrenals are unremarkable. No renal calculi or hydronephrosis. Bladder is poorly distended but appears unremarkable. Stomach/Bowel: Stomach is within normal limits. Bowel is normal in caliber. Normal appendix. Vascular/Lymphatic: No significant vascular abnormality. No enlarged nodes. Reproductive: Enlarged, myomatous uterus. Persistent low-density area posteriorly on the right that may be adnexal. Other: No free fluid.  No acute abnormality of the abdominal wall. Musculoskeletal: No acute osseous abnormality. IMPRESSION: No acute abnormality.  No urinary tract calculi. Enlarged, myomatous uterus. Electronically Signed   By: Macy Mis M.D.   On: 11/04/2021 13:35   ? ?Procedures ?Procedures  ? ? ?Medications Ordered in ED ?Medications  ?fentaNYL (SUBLIMAZE) injection 50 mcg (50 mcg Intravenous Given 11/04/21 1214)  ?ondansetron [facility]) injection 4 mg (4 mg Intravenous Given 11/04/21 1211)  ?ketorolac (TORADOL) 30 MG/ML injection 15 mg (15 mg Intravenous Given 11/04/21 1223)  ? ? ?ED Course/ Medical Decision Making/ A&P ?  ?                        ?Medical Decision Making ?Amount and/or Complexity of Data Reviewed ?Labs: ordered. ?Radiology: ordered. ? ?Risk ?Prescription drug management. ? ? ?Patient presents with lower abdominal pain/pelvic pain.  Started this morning.  Has had some vomiting.  No diarrhea.  Started menses a little late today.  No fevers or chills.  Lab work reviewed and reassuring.  White count mildly elevated.  Not  pregnant.  Urine did show some blood.  CT scan done with stone protocol due to pelvic pain that went to the flank.  Did not show stone but showed fibroid uterus and chronic right adnexal findings.  Discussed with patient about results and ultrasound ordered due to previous ultrasound not showing the left-sided ovary.  If negative were not visualized having patient stable to be discharged home.  Pelvis deferred after discussion with patient.  Doubt STD.  Potentially could be pain from the fibroids.  Likely discharge home with pain meds.  Care turned over to Dr. Vallery Ridge ? ? ? ? ? ? ? ?Final Clinical Impression(s) / ED Diagnoses ?Final diagnoses:  ?Pelvic pain  ? ? ?Rx / DC Orders ?ED Discharge Orders   ? ? None  ? ?  ? ? ?  ?Davonna Belling, MD ?11/04/21 1545 ? ?

## 2021-11-04 NOTE — ED Notes (Signed)
Called lab @ 1702 to add on urine culture.  ?

## 2021-11-04 NOTE — ED Notes (Signed)
Patient transported to Ultrasound 

## 2021-11-04 NOTE — ED Notes (Signed)
Fiance present at bedside. Pt/fiance updated on care and discharge instructions reviewed. All questions/concerns addressed at this time Fiance to drive pt home. Pt ambulatory to ED exit.  ?

## 2021-11-04 NOTE — ED Provider Notes (Signed)
Pelvic ultrasound to review if unremarkable anticipate discharge with pain medications. ? ? ?Patient reports abdominal pain has been lower and diffuse.  At onset it was symmetric.  There was a cramping fullness and burning quality that started sometime late last night or in the early morning hours.  She reports it has shifted back and forth somewhat from side to side and now it has somewhat worse quality on the left radiating up more toward the flank.  Patient reports she just started her menstrual cycle after arriving to the emergency department.  She was somewhat behind on the cycle.  She has gotten improved pain relief and pain medications administered. ?Physical Exam  ?BP 133/89   Pulse 75   Temp 98.1 ?F (36.7 ?C) (Oral)   Resp 16   Ht '5\' 1"'$  (1.549 m)   Wt 63.5 kg   LMP 10/04/2021   SpO2 100%   BMI 26.45 kg/m?  ? ?Physical Exam ?Constitutional:   ?   Comments: Alert nontoxic clear mental status  ?Pulmonary:  ?   Effort: Pulmonary effort is normal.  ?Abdominal:  ?   Comments: Abdomen is soft without guarding.  Mild diffuse lower abdominal discomfort to palpation slightly greater on the left.  ? ? ?Procedures  ?Procedures ? ?ED Course / MDM  ?  ?Medical Decision Making ?Amount and/or Complexity of Data Reviewed ?Labs: ordered. ?Radiology: ordered. ? ?Risk ?Prescription drug management. ? ? ?Ultrasound results reviewed.  Patient's left ovary is visualized with good flow.  I did confirm with the radiologist left and right given that on his study 6 months ago there was poor visualization of the left but adequate visualization of the right.  He confirms the left ovary is visualized.  This combined with patient's pain predominantly to the left but also diffuse likely rules out torsion.  Patient's pain at onset is not suggestive of torsion.  Patient does have significant fibroids.  I suspect with onset of menstrual cycle and fibroids patient is experiencing dysmenorrhea.  Urinalysis does have both trace blood  and trace white cells.  Specimen is good without squamous epithelial cells.  Given diffuse low suprapubic pain and some radiation toward the flank will opt to treat empirically and get culture.  Pain control patient as prescribed ibuprofen for baseline pain control with use of the hydrocodone for severe pain.  Keflex prescribed for antibiotic.  Return precautions reviewed. ? ? ? ? ?  ?Charlesetta Shanks, MD ?11/04/21 1704 ? ?

## 2021-11-04 NOTE — Discharge Instructions (Addendum)
1.  Your pain may be due to fibroids and the onset of your menstrual cycle.  Take ibuprofen for pain as prescribed.  If you need additional pain control you may take 1-2 Vicodin.  Use the Vicodin sparingly.  It can cause constipation and become addictive. ?2.  Your urine shows mild signs of infection.  You are being started on antibiotic.  A urine culture has been done.  Urine culture will help determine if there is significant infection present in guide ongoing antibiotic use. ?3.  Follow-up with your gynecologist as soon as possible for recheck. ?4.  Return to emergency department if you develop worsening pain, fever or other concerning symptoms. ?

## 2021-11-04 NOTE — ED Notes (Signed)
Patient transported to CT 

## 2021-11-04 NOTE — ED Triage Notes (Signed)
Pt c/o abd pain vomited x 2-sx started ~6am-denies diarrhea, vaginal d/c and urianry sx-NAD-steady gait ?

## 2021-11-06 LAB — URINE CULTURE: Culture: <10000 — AB

## 2023-06-02 IMAGING — CR DG CHEST 2V
2 series · 2 of 2 positions shown · non-contrast
Comparison: Chest x-ray 04/13/2019.

CLINICAL DATA: 30-year-old female with history of left-sided chest
pain for the past 2 days.

EXAM:
CHEST - 2 VIEW

[w chest pa]
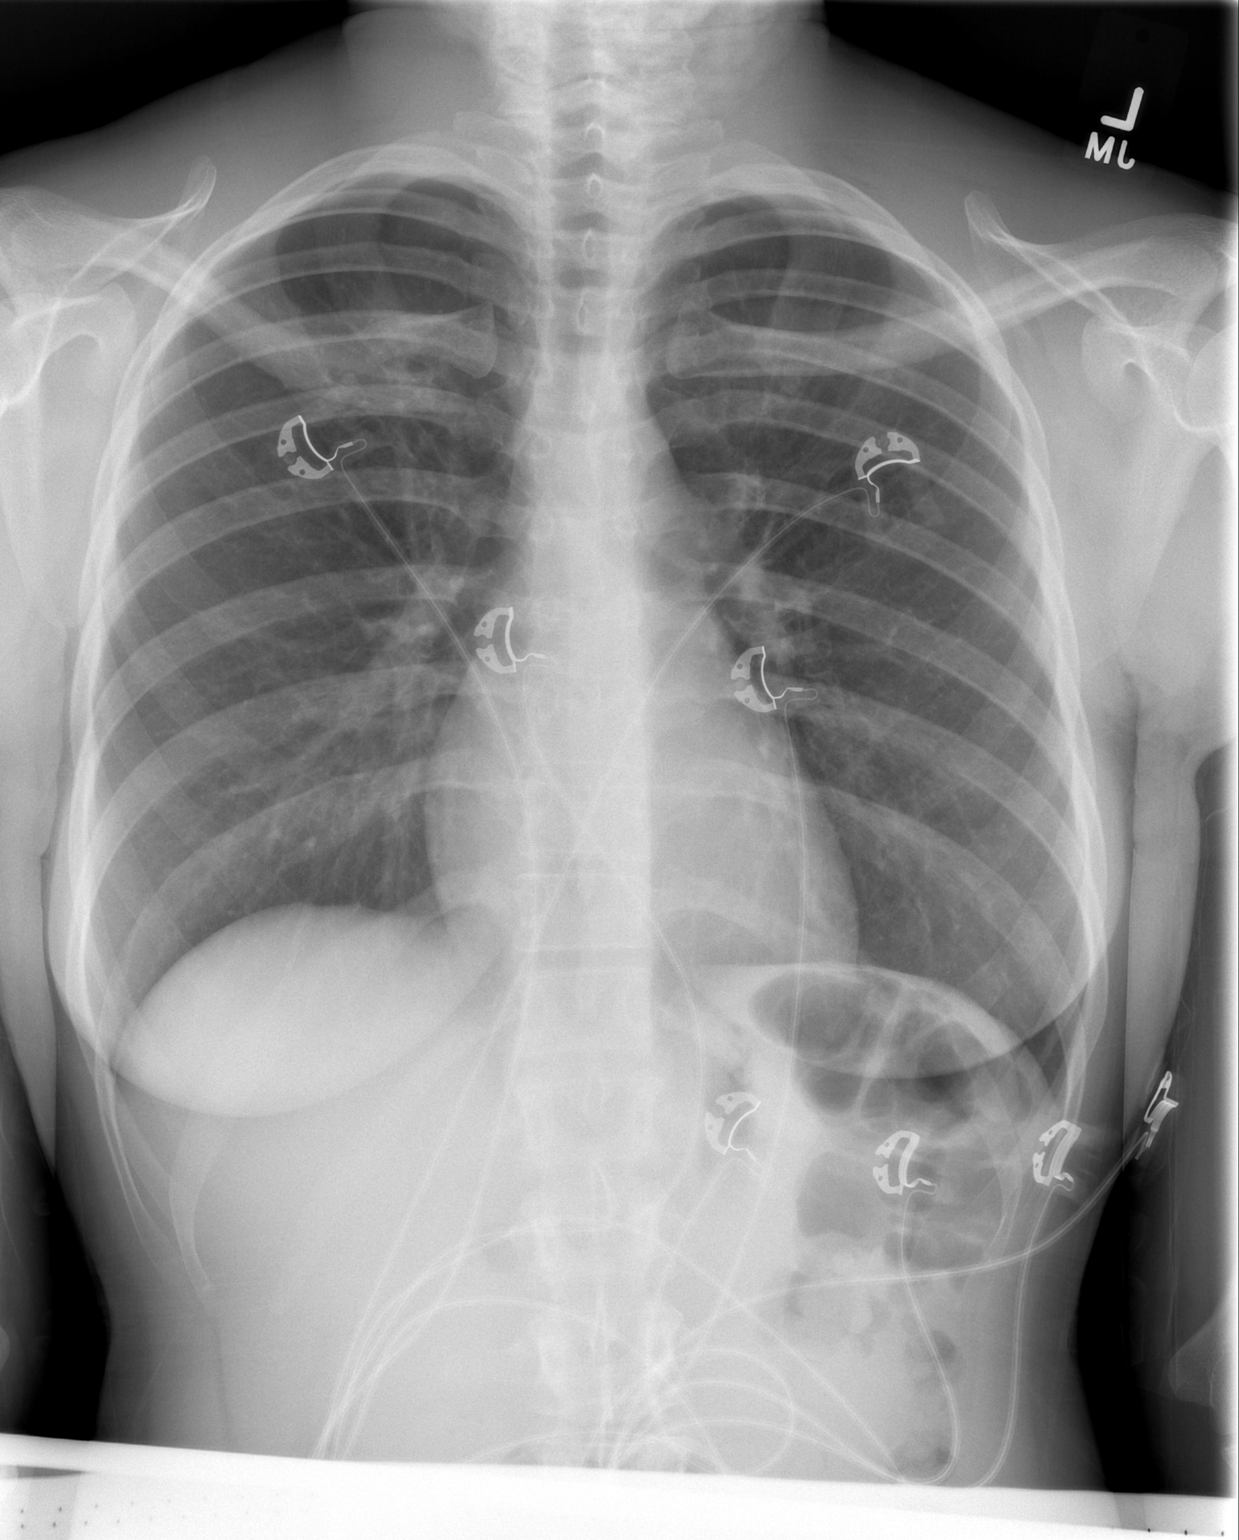

[w chest lat]
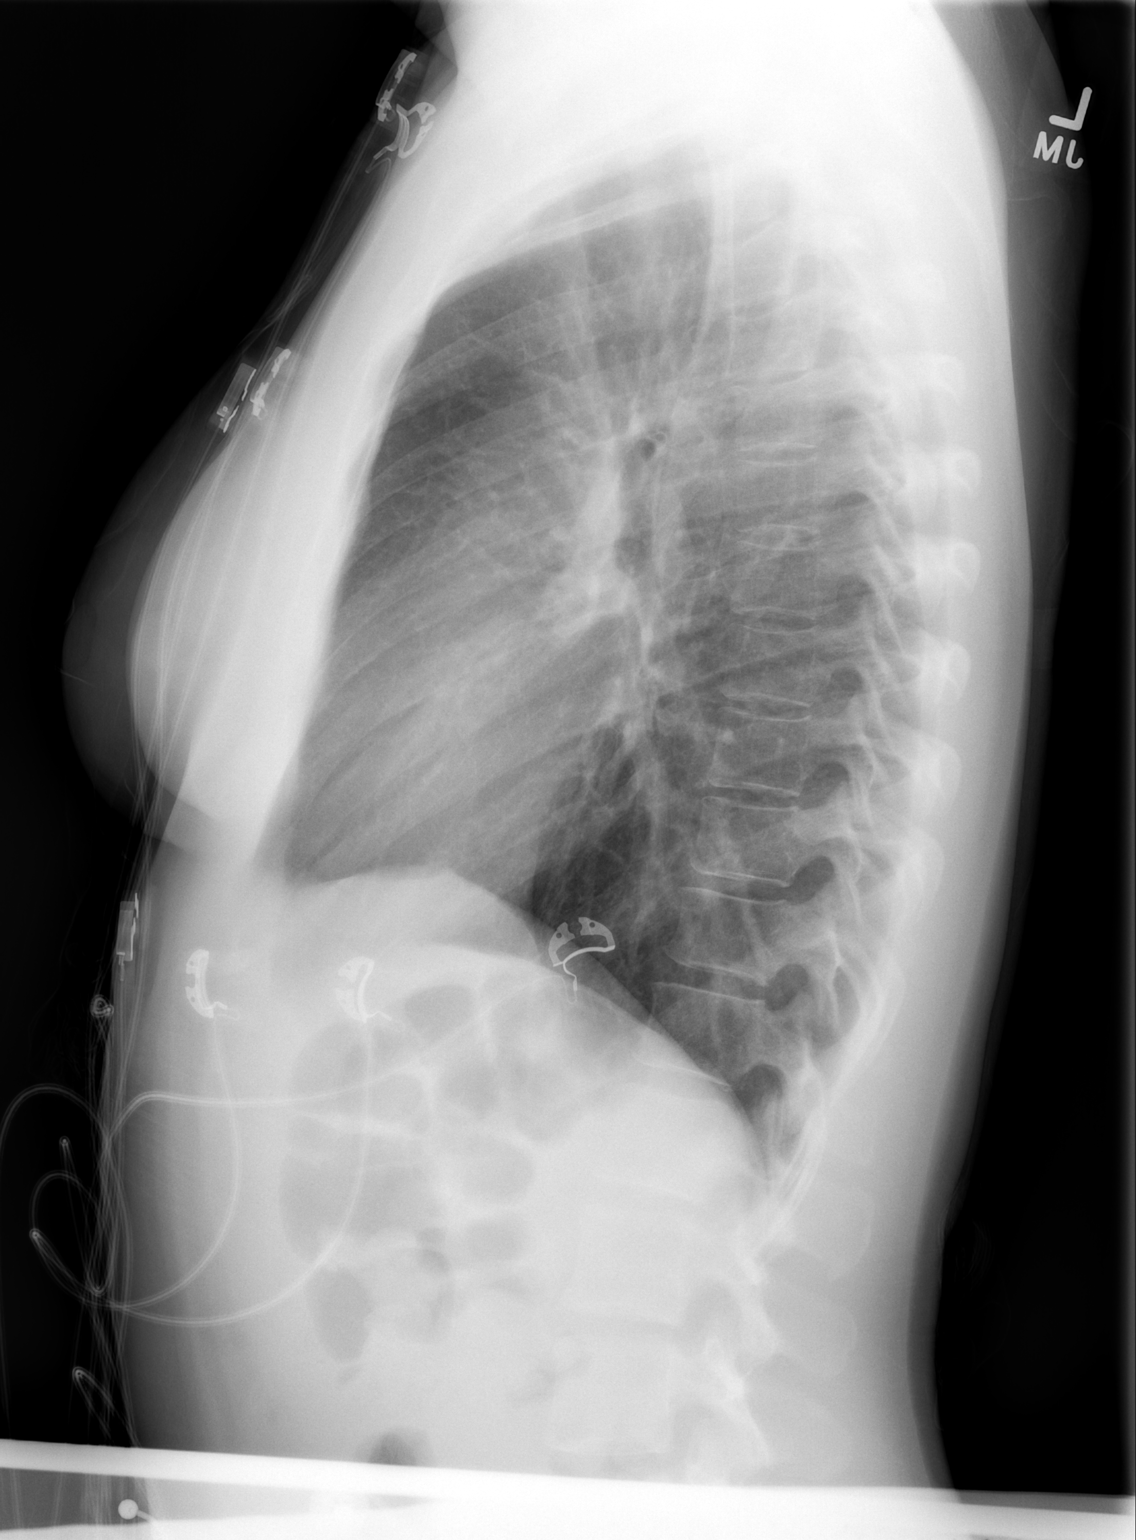

[2 of 2 positions shown; findings below may reference images not displayed]

FINDINGS: Lung volumes are normal. No consolidative airspace disease. No
pleural effusions. No pneumothorax. No pulmonary nodule or mass
noted. Pulmonary vasculature and the cardiomediastinal silhouette
are within normal limits.
IMPRESSION: No radiographic evidence of acute cardiopulmonary disease.

## 2023-10-14 IMAGING — CT CT RENAL STONE PROTOCOL
2 of 4 series · 16 of 46 positions shown, 18 images · non-contrast
Comparison: June 2020

CLINICAL DATA: Flank pain, kidney stone suspected



[Series 2: axial st · axial · 0.63mm/px · z∈[+614,+989]mm · 13 of 83 slices shown, 15 images]
[im 4/83  soft-tissue]
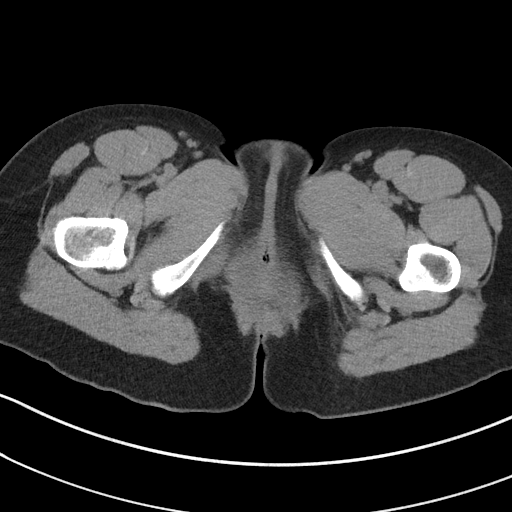
[im 4/83  bone]
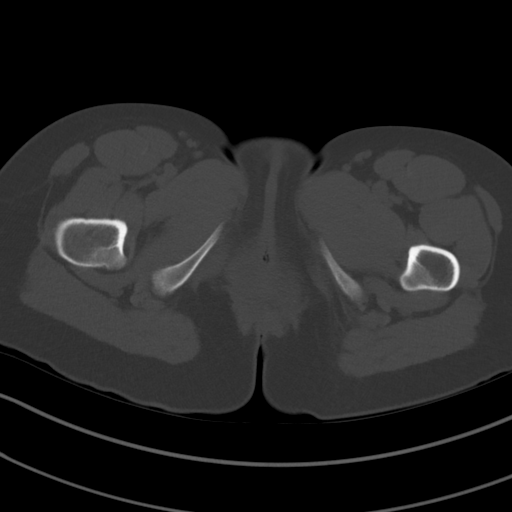
[im 10/83  soft-tissue]
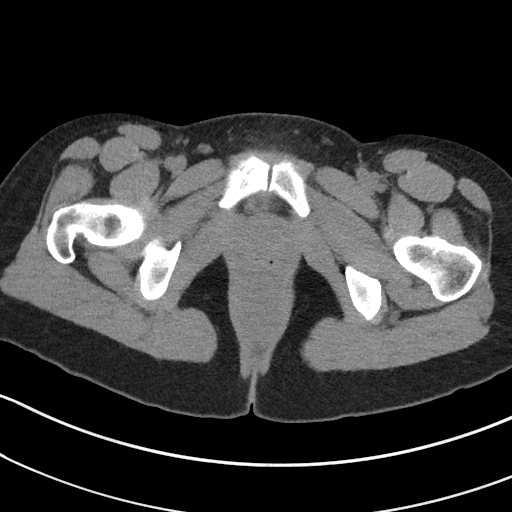
[im 17/83  soft-tissue]
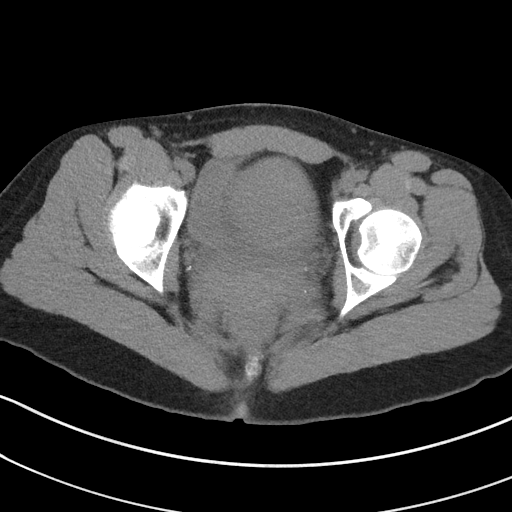
[im 23/83  soft-tissue]
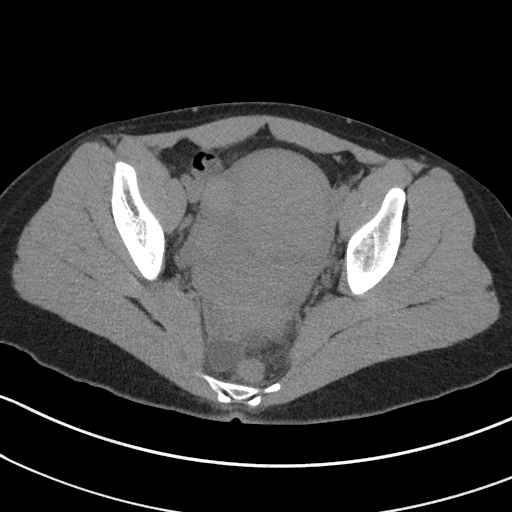
[im 30/83  soft-tissue]
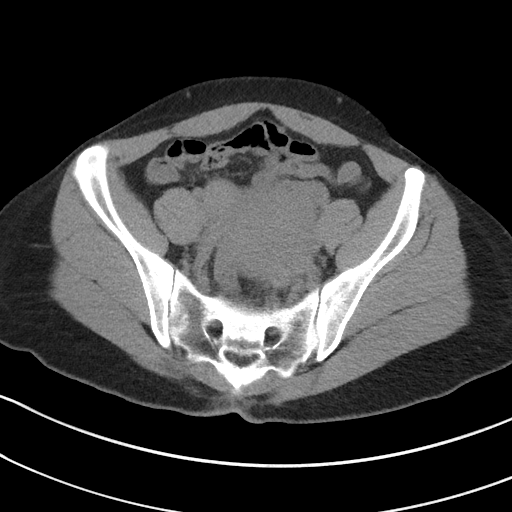
[im 37/83  soft-tissue]
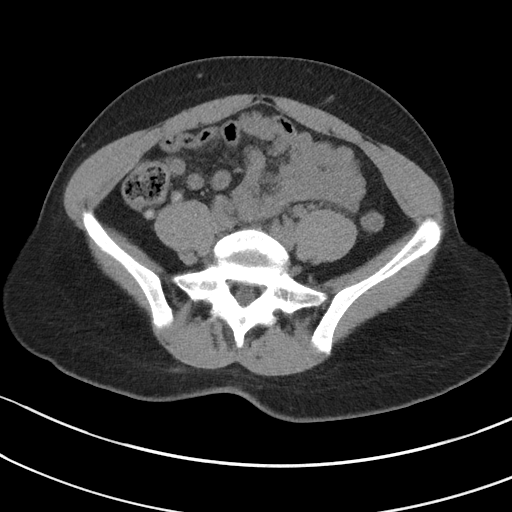
[im 43/83  soft-tissue]
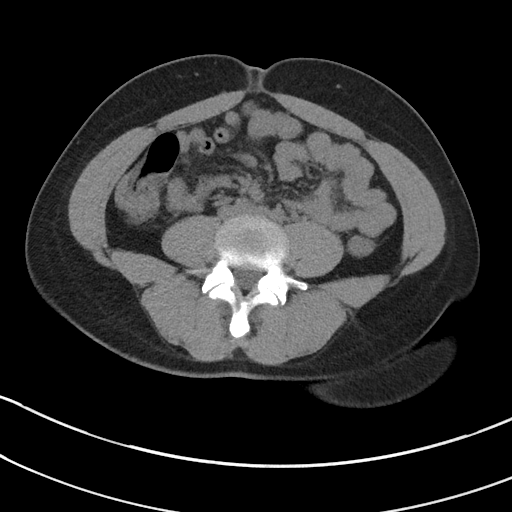
[im 46/83  soft-tissue]
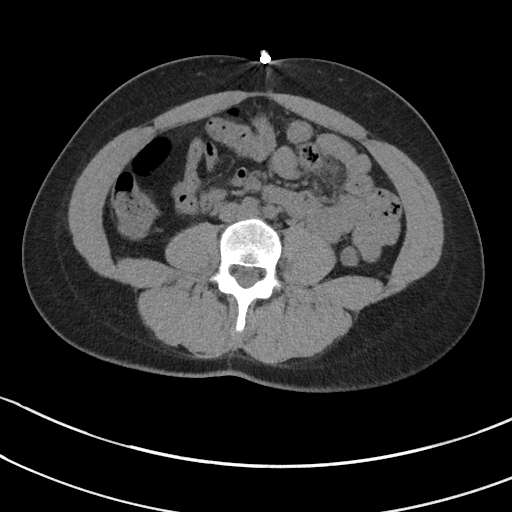
[im 53/83  soft-tissue]
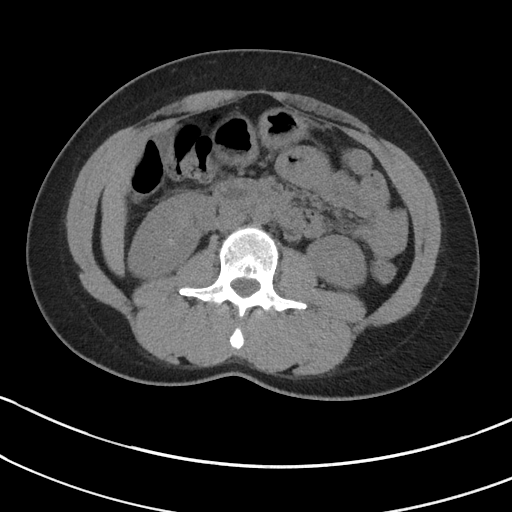
[im 53/83  bone]
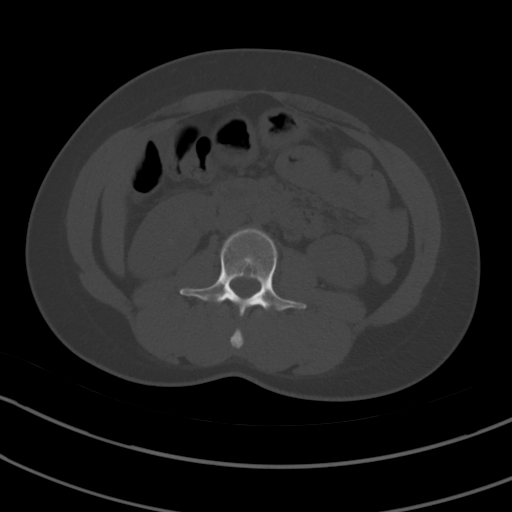
[im 60/83  soft-tissue]
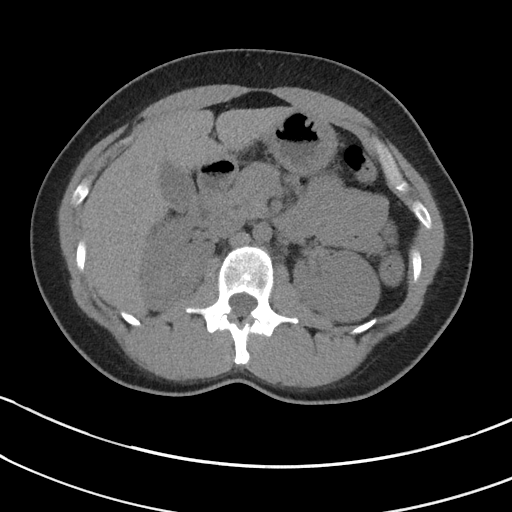
[im 66/83  soft-tissue]
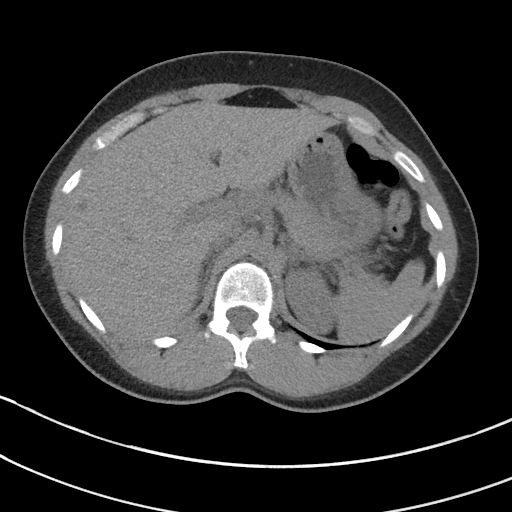
[im 73/83  soft-tissue]
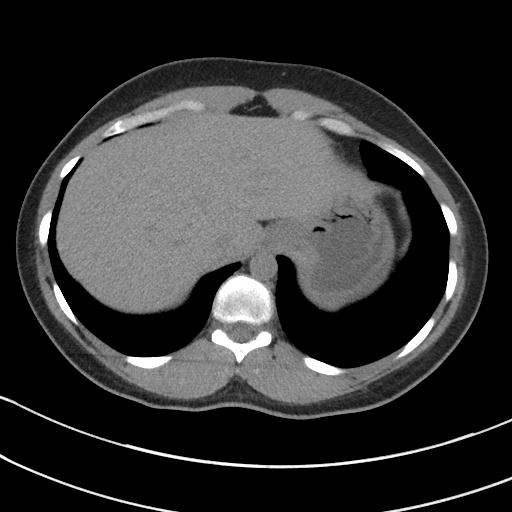
[im 79/83  soft-tissue]
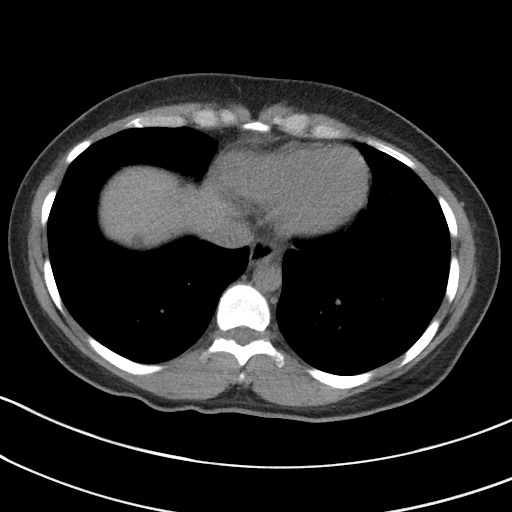

[Series 4: coronal st · coronal · 0.67mm/px · 3 of 77 slices shown]
[im 26/77  soft-tissue]
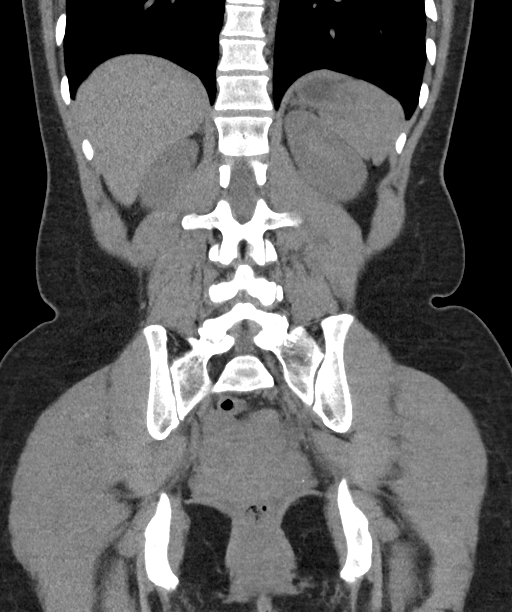
[im 34/77  soft-tissue]
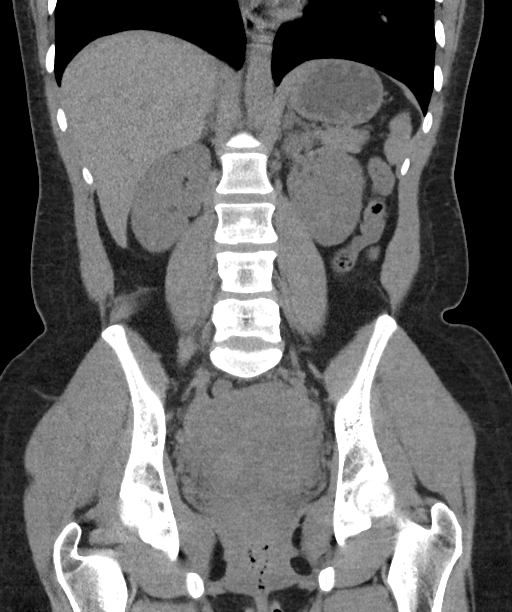
[im 43/77  soft-tissue]
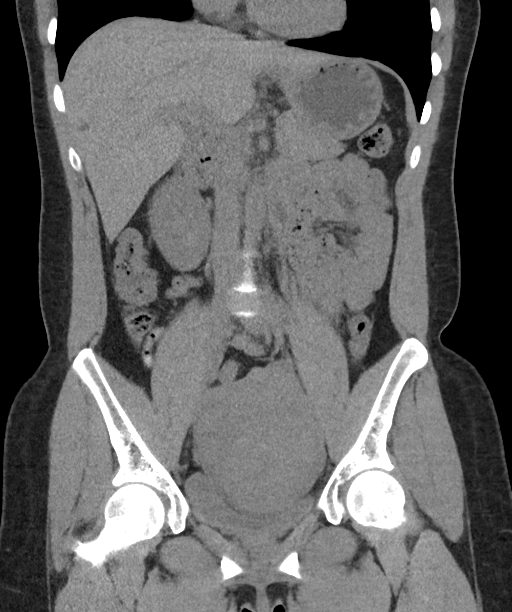

[16 of 46 positions shown; findings below may reference images not displayed]

FINDINGS: Lower chest: No acute abnormality.

Hepatobiliary: Small right hepatic lobe cyst. Gallbladder is
unremarkable. No biliary dilatation.

Pancreas: Unremarkable.

Spleen: Unremarkable.

Adrenals/Urinary Tract: Adrenals are unremarkable. No renal calculi
or hydronephrosis. Bladder is poorly distended but appears
unremarkable.

Stomach/Bowel: Stomach is within normal limits. Bowel is normal in
caliber. Normal appendix.

Vascular/Lymphatic: No significant vascular abnormality. No enlarged
nodes.

Reproductive: Enlarged, myomatous uterus. Persistent low-density
area posteriorly on the right that may be adnexal.

Other: No free fluid.  No acute abnormality of the abdominal wall.

Musculoskeletal: No acute osseous abnormality.
IMPRESSION: No acute abnormality.  No urinary tract calculi.

Enlarged, myomatous uterus.

## 2023-10-14 IMAGING — US US PELVIS COMPLETE TRANSABD/TRANSVAG W DUPLEX AND/OR DOPPLER
1 series · 13 of 25 positions shown · non-contrast
Comparison: CT of same day.

CLINICAL DATA: Pelvic pain.

EXAM:
TRANSABDOMINAL AND TRANSVAGINAL ULTRASOUND OF PELVIS
DOPPLER ULTRASOUND OF OVARIES
TECHNIQUE: Both transabdominal and transvaginal ultrasound examinations of the
pelvis were performed. Transabdominal technique was performed for
global imaging of the pelvis including uterus, ovaries, adnexal
regions, and pelvic cul-de-sac.
It was necessary to proceed with endovaginal exam following the
transabdominal exam to visualize the endometrium and ovaries. Color
and duplex Doppler ultrasound was utilized to evaluate blood flow to
the ovaries.

[Series 1: us pelvis complete transabd/transvag w duplex and/ · 79 acquisitions, 13 frames shown]
[im 1/79]
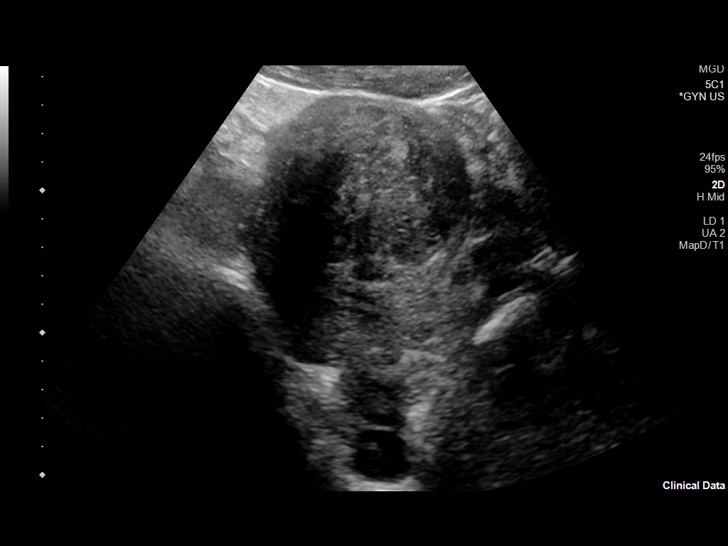
[im 7/79]
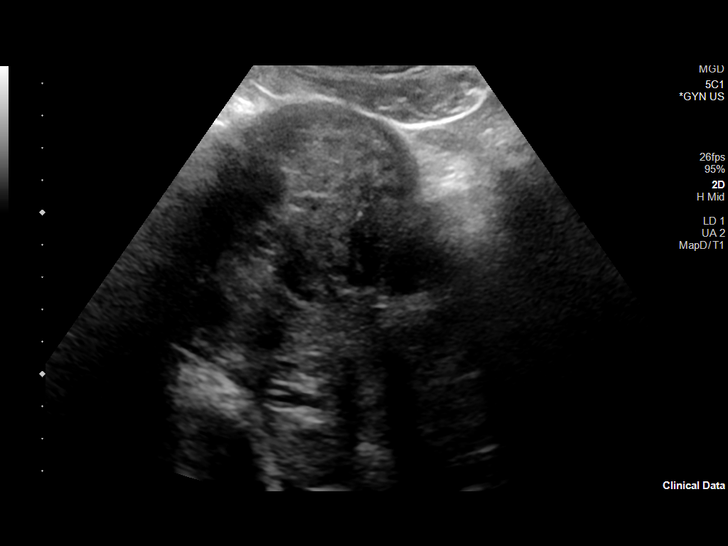
[im 14/79]
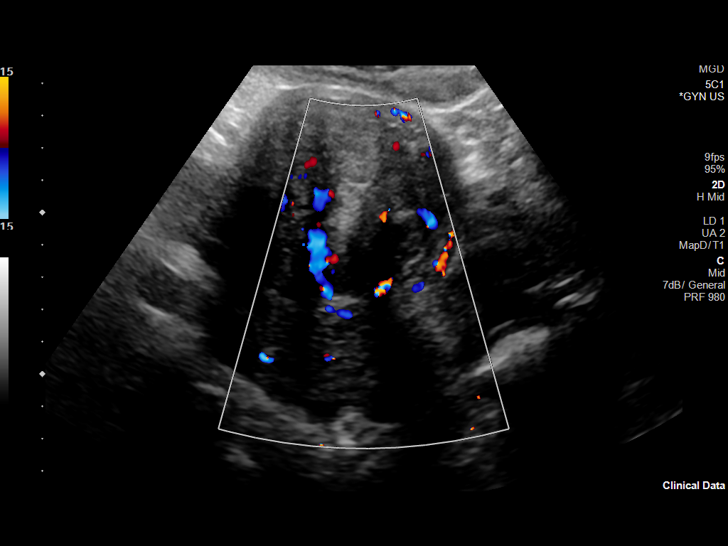
[im 20/79]
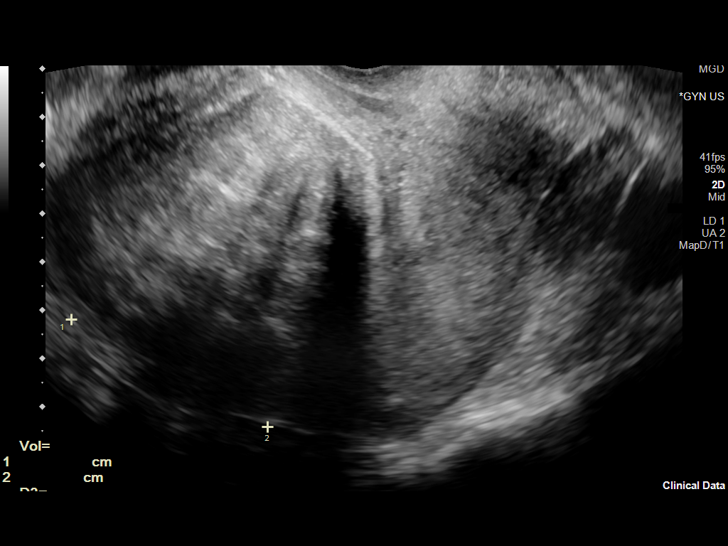
[im 27/79]
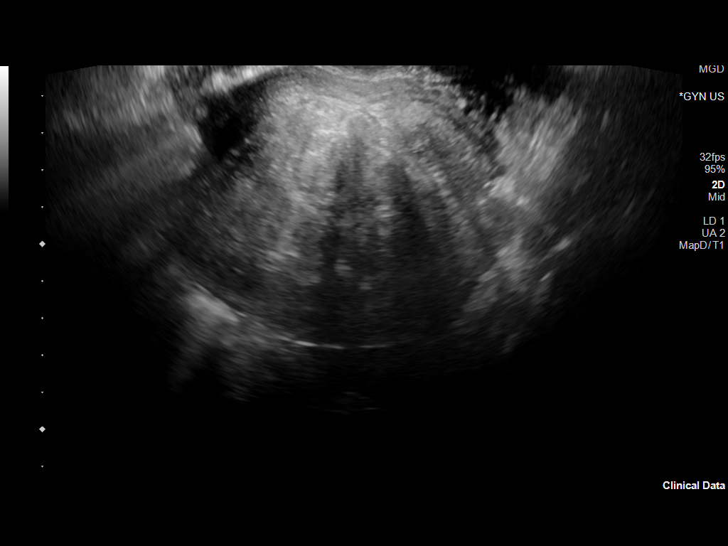
[im 33/79]
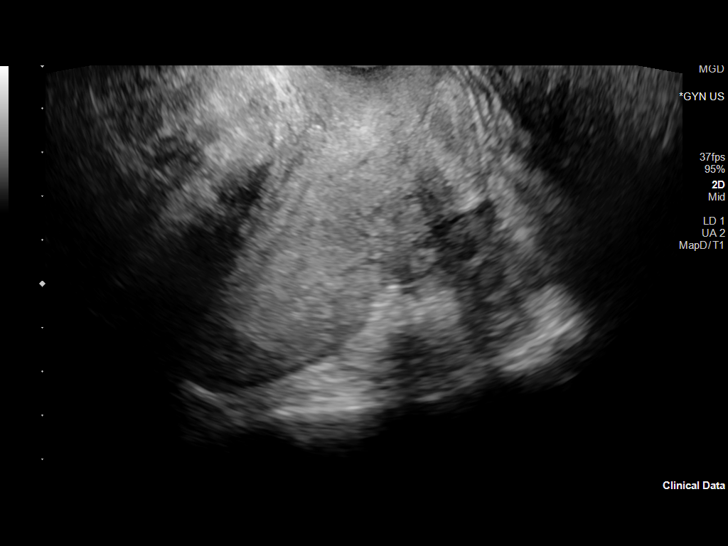
[im 40/79]
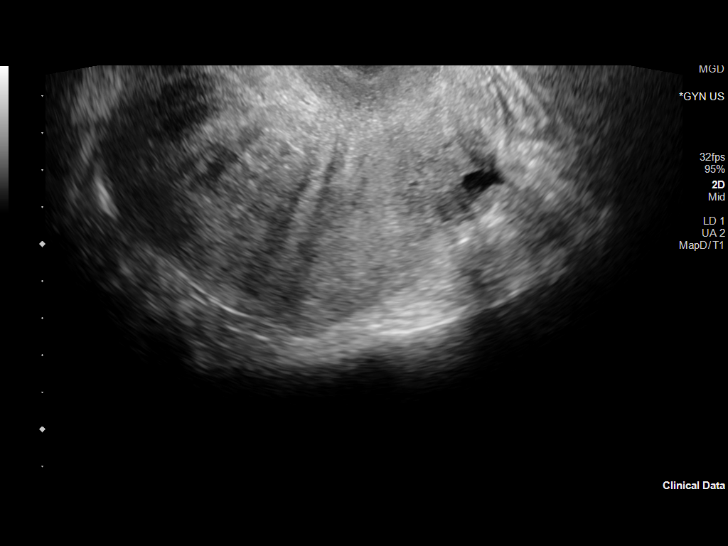
[im 46/79]
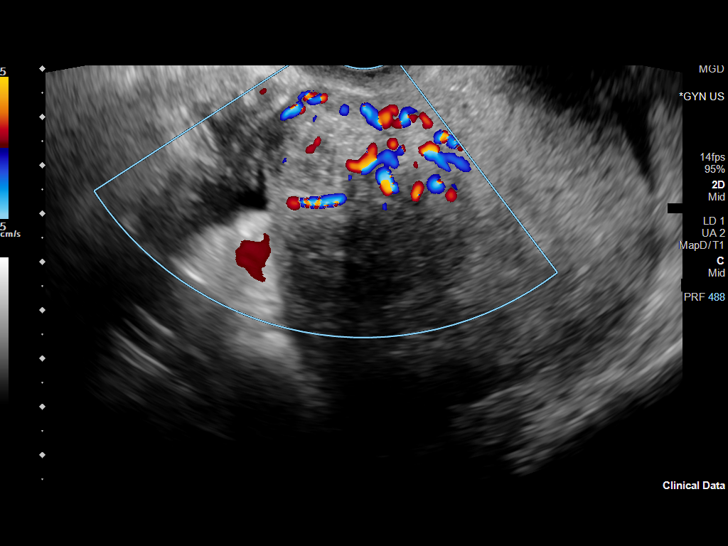
[im 53/79]
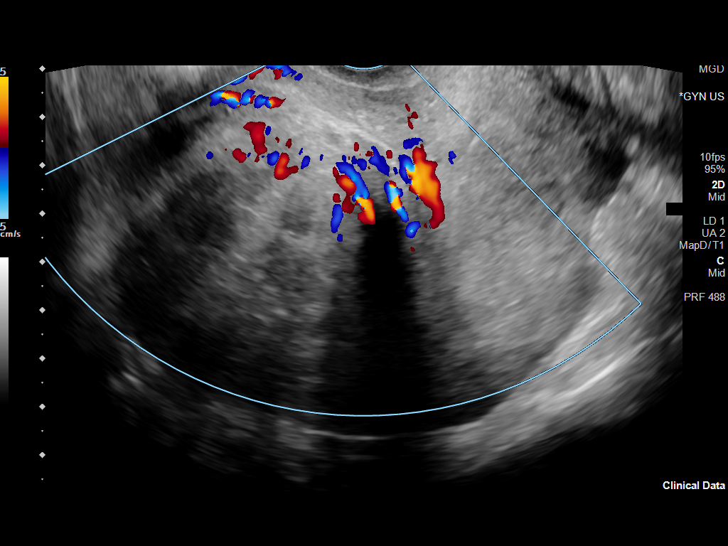
[im 59/79]
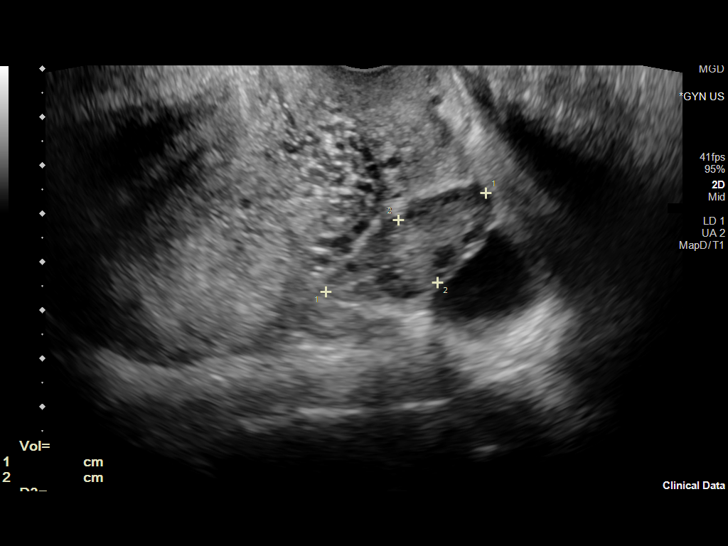
[im 66/79]
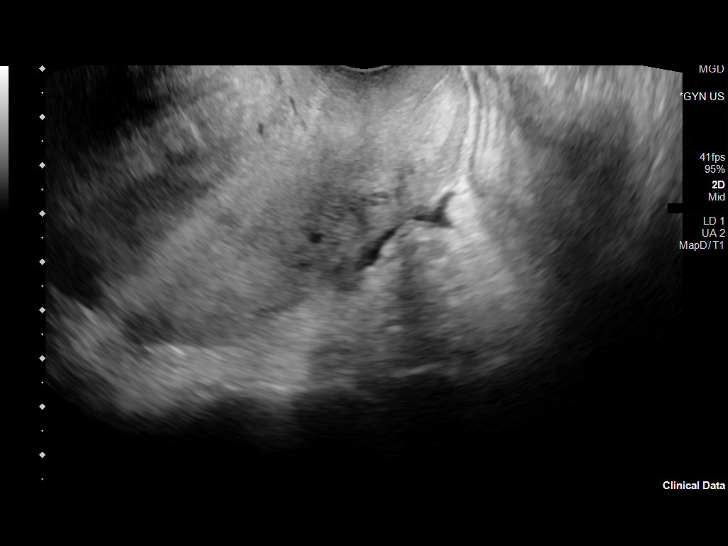
[im 72/79]
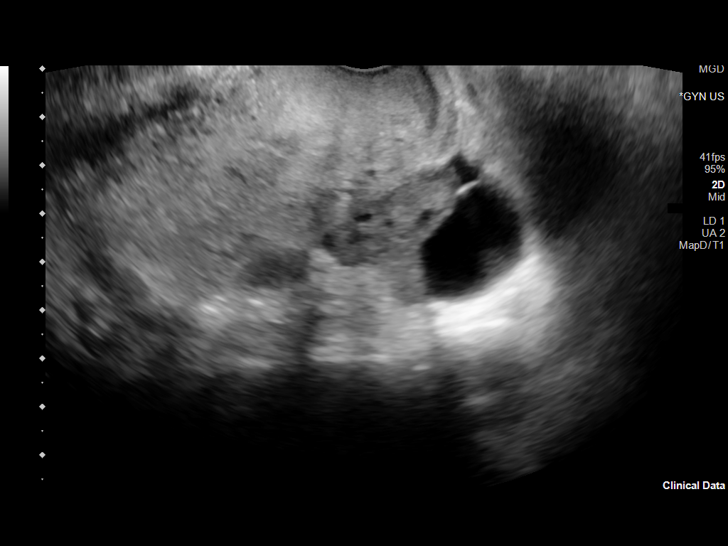
[im 79/79]
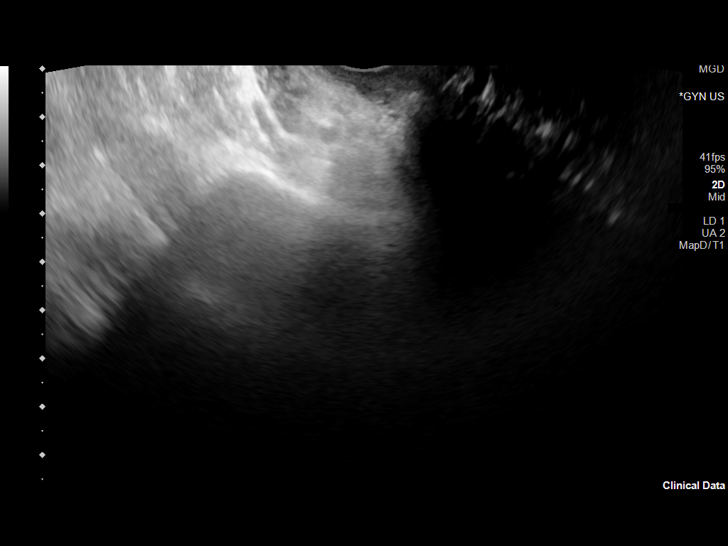

[13 of 25 positions shown; findings below may reference images not displayed]

FINDINGS: Uterus

Measurements: 11.5 x 8.1 x 7.8 cm = volume: 390 mL. Multiple
fibroids are noted, the largest measuring 5.8 x 5.1 x 5.0 cm.

Endometrium

Thickness: 12 mm which is within normal limits. No focal abnormality
visualized.

Right ovary

Not visualized.

Left ovary

Measurements: 3.8 x 2.5 x 1.5 cm = volume: 8 mL. 2.2 cm cyst is
noted.

Pulsed Doppler evaluation of left ovary demonstrates normal
low-resistance arterial and venous waveforms.

Other findings

Trace free fluid is noted which most likely is physiologic.
IMPRESSION: Multiple uterine fibroids are noted, the largest measuring 5.8 cm.

Right ovary is not visualized.

No evidence of significant left ovarian mass or torsion.
